# Patient Record
Sex: Female | Born: 1990 | Race: White | Hispanic: No | Marital: Single | Smoking: Never smoker
Health system: Southern US, Community
[De-identification: ages and names within clinical notes are randomized; demographics above are authoritative.]

## PROBLEM LIST (undated history)

## (undated) DIAGNOSIS — J45990 Exercise induced bronchospasm: Secondary | ICD-10-CM

## (undated) DIAGNOSIS — L309 Dermatitis, unspecified: Secondary | ICD-10-CM

## (undated) DIAGNOSIS — L0292 Furuncle, unspecified: Secondary | ICD-10-CM

## (undated) DIAGNOSIS — F909 Attention-deficit hyperactivity disorder, unspecified type: Secondary | ICD-10-CM

## (undated) HISTORY — DX: Exercise induced bronchospasm: J45.990

## (undated) HISTORY — DX: Furuncle, unspecified: L02.92

## (undated) HISTORY — DX: Attention-deficit hyperactivity disorder, unspecified type: F90.9

## (undated) HISTORY — DX: Dermatitis, unspecified: L30.9

---
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
## 2011-01-01 ENCOUNTER — Encounter: Payer: Self-pay | Admitting: Internal Medicine

## 2011-01-01 ENCOUNTER — Ambulatory Visit (INDEPENDENT_AMBULATORY_CARE_PROVIDER_SITE_OTHER): Payer: PRIVATE HEALTH INSURANCE | Admitting: Internal Medicine

## 2011-01-01 VITALS — BP 100/60 | HR 72 | Ht 63.0 in | Wt 154.0 lb

## 2011-01-01 DIAGNOSIS — F909 Attention-deficit hyperactivity disorder, unspecified type: Secondary | ICD-10-CM

## 2011-01-01 MED ORDER — LISDEXAMFETAMINE DIMESYLATE 30 MG PO CAPS: 30.0000 mg | ORAL_CAPSULE | ORAL | Status: DC

## 2011-01-01 NOTE — Progress Notes (Signed)
  Subjective:    Patient ID: Jessica Leach, female    DOB: 1991/03/17, 20 y.o.   MRN: [id number]  HPI Patient comes in today to discuss add issues. She just finished her freshman year at [employer]. Possible major in Psychology   15 hrs  each semester   Most of classes were larger classes and hard to pay  attention. Hard to focus for extended periods. Had done much better in a small classroom environment in high school. He got mostly B's but a DNA chemistry was 500 people in the classroom and that the statistical mouth. She did do okay this for some recession in [place].     Since her last visit here she has had no major changes in her health status except for episode of throat swelling given an EpiPen and an allergy evaluation. No recurrence unknown cause Review of Systems Negative for chest pain shortness of breath hearing vision issues GI/GU issues normal periods. Sleep is good adequate no snoring sleep apnea excessive alcohol use significant depression anxiety headaches.  Past history family history social history reviewed in the electronic medical record.      Objective:   Physical Exam Physical Exam: Vital signs reviewed [id number] is a well-developed well-nourished alert cooperative  white female who appears her stated age in no acute distress.  HEENT: normocephalic  traumatic , Eyes: PERRL EOM's full, conjunctiva clear, Nares: paten,t no deformity discharge or tenderness., Ears: no deformity EAC's clear TMs with normal landmarks. Mouth: clear OP, no lesions, edema.  Moist mucous membranes. Dentition in adequate repair. NECK: supple without masses, thyromegaly or bruits. CHEST/PULM:  Clear to auscultation and percussion breath sounds equal no wheeze , rales or rhonchi. No chest wall deformities or tenderness. CV: PMI is nondisplaced, S1 S2 no gallops, murmurs, rubs. Peripheral pulses are full without delay.No JVD .  ABDOMEN: Bowel sounds normal nontender  No guard or rebound, no  hepato splenomegal no CVA tenderness.   Extremtities:  No clubbing cyanosis or edema, no acute joint swelling or redness no focal atrophy NEURO:  Oriented x3, cranial nerves 3-12 appear to be intact, no obvious focal weakness,gait within normal limits no abnormal reflexes or asymmetrical SKIN: No acute rashes normal turgor, color, no bruising or petechiae. PSYCH: Oriented, , no obvious depression anxiety, cognition and judgment appear normal. Somewhat fidgety .      Assessment & Plan:  Add/adhd Previous evaluation in middle school 3 [employer] [facility]. Medication was an option at that time the decision was to not use that then. Jessica Leach is now feeling the difficulties with concentration and a larger more intense academic setting. She wishes to try medication in addition to other accommodations that she is tried.   At this time I do not believe need for further testing however if she needs more accommodations would have her be reevaluated.  Do not see underlying cause her habits aggravating the current problem.  Estimate length use of stimulant medications safety risk benefit. She did sign a controlled substances contract.  We'll begin with Vyvanse 30 mg one a day she will try to get a coupon on line. ROV in about a month before school starts. To adjust med dose as needed. She agrees to this plan.

## 2011-01-01 NOTE — Patient Instructions (Signed)
Can begin  vyvanse 30 mg  Each day  . Monitor concentration  And any side effects.  We may need to increase dose as needed. ROV  before school starts.   To decide on dosing and plan.

## 2011-01-28 ENCOUNTER — Telehealth: Payer: Self-pay | Admitting: *Deleted

## 2011-01-28 NOTE — Telephone Encounter (Signed)
[facility] Triage Call Report Triage Record Num: [id number] Operator: Annamaria Helling Patient Name: Jessica Leach Call Date & Time: 01/26/2011 12:37:37PM Patient Phone: [phone] PCP: Neta Mends. Panosh Patient Gender: Female PCP Fax : [phone] Patient DOB: September 07, 1990 Practice Name: [facility] Reason for Call: Tresa Endo, Mother, calling regarding Other. PCP is Panosh, Vickie Epley number is [phone]. Wt 145lb Grizel/patient calling recent rash, red, raised, itchy behind both knees and calf area. Onset 1week ago. Afebrile. History of Eczema couple of years ago. LMP 3weeks ago. Denies current pregnancy or breastfeeding. Current use of Hydrocortisone cream to area. All emergent symptoms per Rash or Redness-Localized and Cause unknown protocal r/o except "Severe localized itching and after 2 days of steroid cream and antihistamines" Instructed patient to call PCP office on 01/28/2011 AM to schedule appointment for MD to assess rash, verbalized understanding. Home care advice given. Verifed correct dose of Benadryl OTC per dosage chart. Protocol(s) Used: Itching Localized And Cause Unknown (Pediatric) Rash or Redness - Localized And Cause Unknown (Pediatric) Protocol(s) Used: Recommended Outcome per Protocol: See Provider within 72 Hours Reason for Outcome: Localized rash also present [1] Severe localized itching AND [2] after 2 days of steroid cream and antihistamines Care Advice: ~ LOCAL COLD: Apply ice or soak in cold water for 20 minutes every 3 or 4 hours to reduce itching or pain. AVOID SCRATCHING: Cut the fingernails short and discourage scratching to prevent a secondary infection from bacteria. ~ ANTIHISTAMINES: If itching persists, give Benadryl orally (OTC) every 6 hours as needed. (See Dosage table) Teen dose: 50 mg. ~ CALL BACK IF: - Rash becomes worse ~ ~ CARE ADVICE given per Rash or Redness - Localized and Cause Unknown (Pediatric)  guideline. HYDROCORTISONE CREAM: If the itch is more than mild, apply 1% hydrocortisone cream OTC ([place]: 0.5%) 3 times per day until it feels better. (Exception: suspected ringworm or impetigo.) ~ SEE PCP WITHIN 3 DAYS: Your child needs to be examined within 2 or 3 days. Call your child's doctor during regular office hours and make an appointment. (Note: if office will be open tomorrow, tell caller to call then, not in 3 days) ~ AVOID the CAUSE: - Try to find the cause. - Consider irritants like a plant (e.g., poison ivy or evergreens), chemicals (e.g., solvents or insecticides), Fiberglass, a new cosmetic, or new jewelry (called contact dermatitis). - A pet may be the intermediary (e.g., with poison ivy or poison oak) or the child may react directly to pet saliva. ~ 01/26/2011 12:54:46PM Page 1 of 1 [email or web address]

## 2011-01-29 ENCOUNTER — Encounter: Payer: Self-pay | Admitting: Internal Medicine

## 2011-01-29 ENCOUNTER — Ambulatory Visit (INDEPENDENT_AMBULATORY_CARE_PROVIDER_SITE_OTHER): Payer: PRIVATE HEALTH INSURANCE | Admitting: Internal Medicine

## 2011-01-29 VITALS — BP 100/70 | HR 60 | Wt 152.0 lb

## 2011-01-29 DIAGNOSIS — L739 Follicular disorder, unspecified: Secondary | ICD-10-CM

## 2011-01-29 DIAGNOSIS — L0292 Furuncle, unspecified: Secondary | ICD-10-CM

## 2011-01-29 DIAGNOSIS — F909 Attention-deficit hyperactivity disorder, unspecified type: Secondary | ICD-10-CM

## 2011-01-29 DIAGNOSIS — L738 Other specified follicular disorders: Secondary | ICD-10-CM

## 2011-01-29 HISTORY — DX: Furuncle, unspecified: L02.92

## 2011-01-29 MED ORDER — LISDEXAMFETAMINE DIMESYLATE 30 MG PO CAPS: 30.0000 mg | ORAL_CAPSULE | ORAL | Status: DC

## 2011-01-29 MED ORDER — DOXYCYCLINE HYCLATE 100 MG PO TABS: 100.0000 mg | ORAL_TABLET | Freq: Two times a day (BID) | ORAL | Status: AC

## 2011-01-29 NOTE — Progress Notes (Signed)
  Subjective:    Patient ID: Jessica Leach, female    DOB: [birth year], 20 y.o.   MRN: [id number]  HPI Patient comes in for followup of initiation of ADHD medication. She noted that the 30 mg of Vyvanse has been quite helpful in motivation focusing and finishing. Denies any significant side effects such as nausea vomiting serious mood issues. She did have a headache at the beginning and affected her sleep if she takes it late. But overall thinks it works pretty well. No chest pain shortness of breath.  New problem over the last weeks having bumps on her legs and some pustules last night that she squeezed to get pus out of this a large one on left thigh and a number of them behind her knees. She thinks it was eczema tends to scratch a lot. No other treatment.   Review of Systems Negative for vision change fever joint pain other rashes see above. Going back to school in less than 2 weeks.  Past history family history social history reviewed in the electronic medical record.     Objective:   Physical Exam Physical Exam: Vital signs reviewed [id number] is a well-developed well-nourished alert cooperative  white female who appears her stated age in no acute distress.  NECK: supple without masses, thyromegaly or bruits. CHEST/PULM:  Clear to auscultation and percussion breath sounds equal no wheeze , rales or rhonchi. No chest wall deformities or tenderness. CV: PMI is nondisplaced, S1 S2 no gallops, murmurs, rubs. Peripheral pulses are full without delay.No JVD .   Extremtities:  No clubbing cyanosis or edema, no acute joint swelling or redness no focal atrophy NEURO:  Oriented x3, cranial nerves 3-12 appear to be intact, no obvious focal weakness,gait within normal limits no abnormal reflexes or asymmetrical SKIN: Lower extremities left medial thigh about a 2 cm boil that is nonfluctuant but does have a center and surrounding erythema without streaking. There is another small boil on the right  calf and multiple papules folliculitis like him popliteal area as and ankles. There are no vesicles. No fluctuant PSYCH: Oriented, good eye contact, no obvious depression anxiety, cognition and judgment appear normal.      Assessment & Plan:  ADHD  Seemingly good response to medication. Given refill today have her sign a release so that her mom can help you out a prescription when she goes away to school. Med check in 3-4 months. Call if she feels elevated dosing might be more helpful. Caution with when she takes the medicine.  Skin infection boils  possible folliculitis from shaving and secondary infections. Nothing to culture today for concern about a staph infection no systemic symptoms. She does have a history of eczema.  Discussion local management and antibiotic compresses local care. Seek care with progression. No problem-specific assessment & plan notes found for this encounter.

## 2011-01-29 NOTE — Patient Instructions (Signed)
Take antibiotic for skin infection . Use warm compresses and antibiotic ointment also. Call if fever not getting better   acts like a  staph infection.  Call for refill of the vyvanse  . Sign release to let your mom pick up medication rx.  ROV in 3-4 months when on break    For med check.

## 2011-02-25 ENCOUNTER — Telehealth: Payer: Self-pay | Admitting: *Deleted

## 2011-02-25 NOTE — Telephone Encounter (Signed)
Mom called to ask if Vyvance could be making pt's hands sweat profusely???  Please call pt back if this is a normal reaction.

## 2011-02-25 NOTE — Telephone Encounter (Signed)
I think its possible but not typical  .   Perhaps it will get better with time

## 2011-02-26 NOTE — Telephone Encounter (Signed)
Left message with Dr. Rosezella [place] recommendations.

## 2011-03-05 ENCOUNTER — Telehealth: Payer: Self-pay | Admitting: Internal Medicine

## 2011-03-05 MED ORDER — LISDEXAMFETAMINE DIMESYLATE 40 MG PO CAPS: 40.0000 mg | ORAL_CAPSULE | ORAL | Status: DC

## 2011-03-05 NOTE — Telephone Encounter (Signed)
Ok  To increase to 40 mg vyvanse

## 2011-03-05 NOTE — Telephone Encounter (Signed)
Left message on machine that rx is ready to pick up. 

## 2011-03-05 NOTE — Telephone Encounter (Signed)
Pt is req to increase the Vyvanse to a 40mg  dose instead of 30mg ? Pls notify pts mom when script is ready for pick up. Pts moms name is, Hurshel Party.

## 2011-03-05 NOTE — Telephone Encounter (Signed)
Spoke to pt- she states that the 30mg . Pt takes it at 9am and makes her drowsy by 9:30am. She goes from being fine to being tired. Pt can't do homework at night because it wears off. She has to do her homework during the day.

## 2011-03-25 ENCOUNTER — Telehealth: Payer: Self-pay | Admitting: *Deleted

## 2011-03-25 NOTE — Telephone Encounter (Signed)
Per Dr. Fabian Sharp- does she have a fever, need more info on the pustule.  Left message on machine about this.

## 2011-03-25 NOTE — Telephone Encounter (Signed)
Pt has another pustule on her leg like the ones in July when she saw Dr,. Panosh, and wants to be treated without an office visit. Would like to speak to [facility].

## 2011-03-27 NOTE — Telephone Encounter (Signed)
Spoke to pt and she went to [facility] health at [facility]. They done a culture and gave her antibiotic. She hasn't gotten the results yet. She will let us know once she does.

## 2011-04-22 ENCOUNTER — Other Ambulatory Visit: Payer: Self-pay | Admitting: Internal Medicine

## 2011-04-22 MED ORDER — LISDEXAMFETAMINE DIMESYLATE 40 MG PO CAPS: 40.0000 mg | ORAL_CAPSULE | ORAL | Status: DC

## 2011-04-22 NOTE — Telephone Encounter (Signed)
Printed   Ask how this dosing is doing,

## 2011-04-22 NOTE — Telephone Encounter (Signed)
Pt need new rx requesting vyvanse 40mg 

## 2011-04-22 NOTE — Telephone Encounter (Signed)
Pls advise.  

## 2011-04-23 NOTE — Telephone Encounter (Signed)
Called pt to make aware that rx is ready for pick and to ask how dosage is working for pt.  Left a message for pt to return call.

## 2011-06-03 ENCOUNTER — Other Ambulatory Visit: Payer: Self-pay | Admitting: Internal Medicine

## 2011-06-03 MED ORDER — EPINEPHRINE 0.3 MG/0.3ML IJ DEVI: 0.3000 mg | Freq: Once | INTRAMUSCULAR | Status: AC

## 2011-06-03 MED ORDER — LISDEXAMFETAMINE DIMESYLATE 40 MG PO CAPS: 40.0000 mg | ORAL_CAPSULE | ORAL | Status: DC

## 2011-06-03 NOTE — Telephone Encounter (Signed)
Pt new rx vyvanse 40mg .

## 2011-07-08 ENCOUNTER — Other Ambulatory Visit: Payer: Self-pay | Admitting: Internal Medicine

## 2011-07-08 NOTE — Telephone Encounter (Signed)
Pt need new rx vyvanse 40 mg °

## 2011-07-09 MED ORDER — LISDEXAMFETAMINE DIMESYLATE 40 MG PO CAPS: 40.0000 mg | ORAL_CAPSULE | ORAL | Status: DC

## 2011-07-09 NOTE — Telephone Encounter (Signed)
Rx to be ready in the am. Left message on machine about needing a follow up on meds. She can schedule this when she comes back home for spring break.

## 2011-08-19 ENCOUNTER — Other Ambulatory Visit: Payer: Self-pay | Admitting: Internal Medicine

## 2011-08-19 NOTE — Telephone Encounter (Signed)
Pt need refill on vyanse 40 mg

## 2011-08-20 ENCOUNTER — Telehealth: Payer: Self-pay | Admitting: Internal Medicine

## 2011-08-20 MED ORDER — LISDEXAMFETAMINE DIMESYLATE 40 MG PO CAPS: 40.0000 mg | ORAL_CAPSULE | ORAL | Status: DC

## 2011-08-20 NOTE — Telephone Encounter (Signed)
Patient called stating she needs a refill on her vyvanse. Please assist and inform patient when available for pick up.

## 2011-08-20 NOTE — Telephone Encounter (Signed)
Pt is due for a follow up appt. Rx to be ready in the am. Pt states that she is not coming home but for a Friday, sat and it will be late on Friday. Then she plans on coming home in May. She will call back tomorrow to schedule a follow up in May.

## 2011-09-02 ENCOUNTER — Telehealth: Payer: Self-pay | Admitting: Internal Medicine

## 2011-09-02 NOTE — Telephone Encounter (Signed)
Pt should have gotten some on 2/19 #30. Left message for pt to call back. She should have enough until 3/19.

## 2011-09-02 NOTE — Telephone Encounter (Signed)
Pt called back and does have enough.

## 2011-09-02 NOTE — Telephone Encounter (Signed)
Pt called and said that she doesn't need script for Vyvanse. Pt has enough Vvanse to last until 09/19/11. Pt will call back when she needs another refill.

## 2011-09-19 ENCOUNTER — Other Ambulatory Visit: Payer: Self-pay | Admitting: Internal Medicine

## 2011-09-19 NOTE — Telephone Encounter (Signed)
Pt needs new rx vyvanse 40 mg °

## 2011-09-20 MED ORDER — LISDEXAMFETAMINE DIMESYLATE 40 MG PO CAPS: 40.0000 mg | ORAL_CAPSULE | ORAL | Status: DC

## 2011-09-20 NOTE — Telephone Encounter (Signed)
Pt last seen 01/29/11.  Rx last filled 08/20/11.  Pls advise.

## 2011-09-20 NOTE — Telephone Encounter (Signed)
She needs to get on appt schedule for MAY Then can rx for 30 of the vyvanse  To pick up.  Have her get on schedule  Before giving her the rx .

## 2011-09-20 NOTE — Telephone Encounter (Signed)
Left a message for pt to return call. Rx printed and ready for pick up.

## 2011-10-28 ENCOUNTER — Telehealth: Payer: Self-pay | Admitting: Internal Medicine

## 2011-10-28 NOTE — Telephone Encounter (Signed)
Pt last seen 01/29/11.  Rx last filled 09/20/11.  Pt has upcoming f/u 11/05/11.  Pls advise.

## 2011-10-28 NOTE — Telephone Encounter (Signed)
Ok to refill  But needs to keep her appt.

## 2011-10-28 NOTE — Telephone Encounter (Signed)
Pt called req refill of lisdexamfetamine (VYVANSE) 40 MG capsule. Pls call when ready for pick up.

## 2011-10-29 MED ORDER — LISDEXAMFETAMINE DIMESYLATE 40 MG PO CAPS: 40.0000 mg | ORAL_CAPSULE | ORAL | Status: DC

## 2011-10-29 NOTE — Telephone Encounter (Signed)
Pt aware rx ready for pick up.  Pt states she will be at appt on 11/05/11.

## 2011-11-05 ENCOUNTER — Ambulatory Visit (INDEPENDENT_AMBULATORY_CARE_PROVIDER_SITE_OTHER): Payer: PRIVATE HEALTH INSURANCE | Admitting: Internal Medicine

## 2011-11-05 ENCOUNTER — Encounter: Payer: Self-pay | Admitting: Internal Medicine

## 2011-11-05 VITALS — BP 110/70 | HR 69 | Temp 98.2°F | Wt 148.0 lb

## 2011-11-05 DIAGNOSIS — F909 Attention-deficit hyperactivity disorder, unspecified type: Secondary | ICD-10-CM

## 2011-11-05 DIAGNOSIS — J309 Allergic rhinitis, unspecified: Secondary | ICD-10-CM

## 2011-11-05 DIAGNOSIS — Z79899 Other long term (current) drug therapy: Secondary | ICD-10-CM

## 2011-11-05 DIAGNOSIS — H5789 Other specified disorders of eye and adnexa: Secondary | ICD-10-CM

## 2011-11-05 DIAGNOSIS — H579 Unspecified disorder of eye and adnexa: Secondary | ICD-10-CM

## 2011-11-05 DIAGNOSIS — Z5189 Encounter for other specified aftercare: Secondary | ICD-10-CM

## 2011-11-05 MED ORDER — LISDEXAMFETAMINE DIMESYLATE 40 MG PO CAPS: 40.0000 mg | ORAL_CAPSULE | ORAL | Status: DC

## 2011-11-05 NOTE — Progress Notes (Signed)
  Subjective:    Patient ID: Jessica Leach, female    DOB: Jan 09, 1991, 21 y.o.   MRN: [id number]  HPI Comes in  For med evaluation  .  Just finished exams  At [facility] .  Med seems to do better and lasts longer on the 40 mg of vyvanse  Takes at about   7-8 in am   Done at  5 . Sleep. Ok  NO cp sob aggravated mood issues  Weekend ok when study.   Course load.  On campus  to be in [employer] .  No injuries or other major change in health Neg td or sig etoh.  Asthma  Allergy stable at this time   Right eye irriated at times  Has some focal redness no vision changes  At this time . Not that itchy.  Review of Systems ROS:  GEN/ HEENT: No fever, significant weight changes sweats headaches vision problems hearing changes, CV/ PULM; No chest pain shortness of breath cough, syncope,edema  change in exercise tolerance. GI /GU: No adominal pain, vomiting, change in bowel habits. No blood in the stool. No significant GU symptoms. SKIN/HEME: ,no acute skin rashes suspicious lesions or bleeding. No lymphadenopathy, nodules, masses.  NEURO/ PSYCH:  No neurologic signs such as weakness numbness. No depression anxiety. IMM/ Allergy: No unusual infections.  Allergy .   REST of 12 system review negative except as per HPI  Past history family history social history reviewed in the electronic medical record.     Objective:   Physical Exam BP 110/70  Pulse 69  Temp(Src) 98.2 F (36.8 C) (Oral)  Wt 148 lb (67.132 kg)  SpO2 98%  LMP 10/22/2011 Physical Exam: Vital signs reviewed WUJ:WJXB is a well-developed well-nourished alert cooperative  white female who appears her stated age in no acute distress.  HEENT: normocephalic atraumatic , Eyes: PERRL EOM's full, conjunctiva promonent bv inferior no dc  On right  Nares: paten,t no deformity discharge or tenderness., NECK: supple without masses, thyromegaly or bruits. CHEST/PULM:  Clear to auscultation and percussion breath sounds equal no wheeze ,  rales or rhonchi. No chest wall deformities or tenderness. CV: PMI is nondisplaced, S1 S2 no gallops, murmurs, rubs. Peripheral pulses are full without delay.No JVD .  ABDOMEN: Bowel sounds normal nontender  No guard or rebound, no hepato splenomegal no CVA tenderness.   Extremtities:  No clubbing cyanosis or edema, no acute joint swelling or redness no focal atrophy NEURO:  Oriented x3, cranial nerves 3-12 appear to be intact, no obvious focal weakness,gait within normal limits no abnormal reflexes or asymmetrical no tremor  Some increase fidgeting at times  SKIN: No acute rashes normal turgor, color, no bruising or petechiae. PSYCH: Oriented, good eye contact, no obvious depression anxiety, cognition and judgment appear normal. LN: no cervical  adenopathy      Assessment & Plan:  ADHD  Medication management   Seems to have better response ofn 40 mg would not increase at this time. Refill med 90 days  Call for refill  Wellness visit in 6 months can work in around her school schedule or  If needed for adjustment of meds .  All asthm stable  Focal eye redness or irritation   No ob uveitis conj or scleritis  However if continue sx see eye doc.

## 2011-11-05 NOTE — Patient Instructions (Signed)
Continue same dosing for now.  Get eye check if continues to bother your but looks ok today.  ROV 6 months with check up or thereabout.

## 2011-11-09 ENCOUNTER — Encounter: Payer: Self-pay | Admitting: Internal Medicine

## 2011-11-09 DIAGNOSIS — L309 Dermatitis, unspecified: Secondary | ICD-10-CM | POA: Insufficient documentation

## 2011-11-09 DIAGNOSIS — J309 Allergic rhinitis, unspecified: Secondary | ICD-10-CM | POA: Insufficient documentation

## 2011-11-09 DIAGNOSIS — Z79899 Other long term (current) drug therapy: Secondary | ICD-10-CM | POA: Insufficient documentation

## 2011-12-23 ENCOUNTER — Encounter: Payer: Self-pay | Admitting: Internal Medicine

## 2011-12-23 ENCOUNTER — Ambulatory Visit (INDEPENDENT_AMBULATORY_CARE_PROVIDER_SITE_OTHER): Payer: PRIVATE HEALTH INSURANCE | Admitting: Internal Medicine

## 2011-12-23 VITALS — BP 120/82 | HR 110 | Temp 98.5°F | Wt 141.0 lb

## 2011-12-23 DIAGNOSIS — J069 Acute upper respiratory infection, unspecified: Secondary | ICD-10-CM | POA: Insufficient documentation

## 2011-12-23 MED ORDER — ALBUTEROL SULFATE HFA 108 (90 BASE) MCG/ACT IN AERS: 2.0000 | INHALATION_SPRAY | Freq: Four times a day (QID) | RESPIRATORY_TRACT | Status: DC | PRN

## 2011-12-23 NOTE — Patient Instructions (Signed)
This appears to be a viral respiratory infection. Your lung exam is normal at this time. Most likely will resolve in another week. Can use inhaler if wheeze occurs . Seek care if fever shortness of breath signs .  Antibiotics wont help a viral infection.

## 2011-12-23 NOTE — Progress Notes (Signed)
Subjective:    Patient ID: Jessica Leach, female    DOB: 10-26-1990, 21 y.o.   MRN: [id number]  HPI Patient comes in today for SDA for  new problem evaluation. Onset less than a week ago with coughing chest cold symptoms upper respiratory congestion. See above.Was at the beach and coughing bad   Had sinus headache.   Concern from father because she is going out of country in 2 days to [place] for over a month. No fever.   Took decongestant  allertec d and nyquil .  No ets. No sob and no wheezing  Last decongestant  This am.   Review of Systems Negative for fever chills wheezing chest pain shortness of breath history of inhaler use pre-exercise in the past with sports. No unusual bleeding vision changes. Has had a problem with her hip when she does deep squats with lateral pain she may have strained something gait is normal.  Past history family history social history reviewed in the electronic medical record. Outpatient Encounter Prescriptions as of 12/23/2011  Medication Sig Dispense Refill  . albuterol (VENTOLIN HFA) 108 (90 BASE) MCG/ACT inhaler Inhale 2 puffs into the lungs every 6 (six) hours as needed for wheezing (or pre exercise).  1 Inhaler  2  . cetirizine (ZYRTEC) 10 MG tablet Take 10 mg by mouth daily.      Marland Kitchen EPINEPHrine (EPI-PEN) 0.3 mg/0.3 mL DEVI Inject 0.3 mLs (0.3 mg total) into the muscle once.  1 Device  0  . lisdexamfetamine (VYVANSE) 40 MG capsule Take 1 capsule (40 mg total) by mouth every Leach.  90 capsule  0  . DISCONTD: albuterol (VENTOLIN HFA) 108 (90 BASE) MCG/ACT inhaler Inhale 2 puffs into the lungs every 6 (six) hours as needed.        Marland Kitchen DISCONTD: lisdexamfetamine (VYVANSE) 30 MG capsule Take 30 mg by mouth every Leach.      Marland Kitchen DISCONTD: lisdexamfetamine (VYVANSE) 40 MG capsule Take 1 capsule (40 mg total) by mouth every Leach.  30 capsule  0  . DISCONTD: pseudoephedrine (SUDAFED) 30 MG tablet Take 30 mg by mouth every 4 (four) hours as needed.             Objective:   Physical Exam BP 120/82  Pulse 110  Temp 98.5 F (36.9 C) (Oral)  Wt 141 lb (63.957 kg)  SpO2 99%  LMP 12/16/2011 WDWN in NAD  quiet respirations; mildly congested  somewhat hoarse. Non toxic . HEENT: Normocephalic ;atraumatic , Eyes;  PERRL, EOMs  Full, lids and conjunctiva clear,,Ears: no deformities, canals nl, TM landmarks normal, Nose: no deformity or discharge but congested;face minimally tender Mouth : OP clear without lesion or edema . Neck: Supple without adenopathy or masses or bruits Chest:  Clear to A&P without wheezes rales or rhonchi CV:  S1-S2 no gallops or murmurs peripheral perfusion is normal Skin :nl perfusion and no acute rashes  Musculoskeletal gait is within normal limits points to the area the right lateral hip and anterior iliac crest as area of discomfort when she does certain movements. Good range of motion of hip      Assessment & Plan:  Libby Maw uri  prob viral  leaving for [place] in 2 day . No indication at this time for antibiotic use discussed alarm features for which she should seek care.  Remote hx of eia; no wheezing now but travel with inhaler and sx rx for now; sudafed pre flight to help with  Musculoskeletal strain of hip  girdle avoid deep squats assistance training with bands might be helpful instead. Ice and Advil okay.  Requests copy of her immunization record printed for her from the registry today.

## 2012-03-03 ENCOUNTER — Other Ambulatory Visit: Payer: Self-pay | Admitting: Internal Medicine

## 2012-03-03 MED ORDER — LISDEXAMFETAMINE DIMESYLATE 40 MG PO CAPS: 40.0000 mg | ORAL_CAPSULE | ORAL | Status: DC

## 2012-03-03 NOTE — Telephone Encounter (Signed)
Left message on personal cell for the pt to pick up the rx at the front desk.

## 2012-03-03 NOTE — Telephone Encounter (Signed)
Pt needs new rx vyvanse 40 mg °

## 2012-03-03 NOTE — Telephone Encounter (Signed)
Printed and waiting on [facility] signature.

## 2012-05-08 ENCOUNTER — Encounter: Payer: Self-pay | Admitting: Internal Medicine

## 2012-05-08 ENCOUNTER — Encounter: Payer: PRIVATE HEALTH INSURANCE | Admitting: Internal Medicine

## 2012-05-08 DIAGNOSIS — Z0289 Encounter for other administrative examinations: Secondary | ICD-10-CM

## 2012-05-08 NOTE — Patient Instructions (Signed)
Preventive Care for Adults, Female A healthy lifestyle and preventive care can promote health and wellness. Preventive health guidelines for women include the following key practices.  A routine yearly physical is a good way to check with your caregiver about your health and preventive screening. It is a chance to share any concerns and updates on your health, and to receive a thorough exam.  Visit your dentist for a routine exam and preventive care every 6 months. Brush your teeth twice a day and floss once a day. Good oral hygiene prevents tooth decay and gum disease.  The frequency of eye exams is based on your age, health, family medical history, use of contact lenses, and other factors. Follow your caregiver's recommendations for frequency of eye exams.  Eat a healthy diet. Foods like vegetables, fruits, whole grains, low-fat dairy products, and lean protein foods contain the nutrients you need without too many calories. Decrease your intake of foods high in solid fats, added sugars, and salt. Eat the right amount of calories for you.Get information about a proper diet from your caregiver, if necessary.  Regular physical exercise is one of the most important things you can do for your health. Most adults should get at least 150 minutes of moderate-intensity exercise (any activity that increases your heart rate and causes you to sweat) each week. In addition, most adults need muscle-strengthening exercises on 2 or more days a week.  Maintain a healthy weight. The body mass index (BMI) is a screening tool to identify possible weight problems. It provides an estimate of body fat based on height and weight. Your caregiver can help determine your BMI, and can help you achieve or maintain a healthy weight.For adults 20 years and older:  A BMI below 18.5 is considered underweight.  A BMI of 18.5 to 24.9 is normal.  A BMI of 25 to 29.9 is considered overweight.  A BMI of 30 and above is  considered obese.  Maintain normal blood lipids and cholesterol levels by exercising and minimizing your intake of saturated fat. Eat a balanced diet with plenty of fruit and vegetables. Blood tests for lipids and cholesterol should begin at age 33 and be repeated every 5 years. If your lipid or cholesterol levels are high, you are over 50, or you are at high risk for heart disease, you may need your cholesterol levels checked more frequently.Ongoing high lipid and cholesterol levels should be treated with medicines if diet and exercise are not effective.  If you smoke, find out from your caregiver how to quit. If you do not use tobacco, do not start.  If you are pregnant, do not drink alcohol. If you are breastfeeding, be very cautious about drinking alcohol. If you are not pregnant and choose to drink alcohol, do not exceed 1 drink per day. One drink is considered to be 12 ounces (355 mL) of beer, 5 ounces (148 mL) of wine, or 1.5 ounces (44 mL) of liquor.  Avoid use of street drugs. Do not share needles with anyone. Ask for help if you need support or instructions about stopping the use of drugs.  High blood pressure causes heart disease and increases the risk of stroke. Your blood pressure should be checked at least every 1 to 2 years. Ongoing high blood pressure should be treated with medicines if weight loss and exercise are not effective.  If you are 60 to 20 years old, ask your caregiver if you should take aspirin to prevent strokes.  screening involves taking a blood sample to check your fasting blood sugar level. This should be done once every 3 years, after age 45, if you are within normal weight and without risk factors for diabetes. Testing should be considered at a younger age or be carried out more frequently if you are overweight and have at least 1 risk factor for diabetes.  Breast cancer screening is essential preventive care for women. You should practice "breast  self-awareness." This means understanding the normal appearance and feel of your breasts and may include breast self-examination. Any changes detected, no matter how small, should be reported to a caregiver. Women in their 20s and 30s should have a clinical breast exam (CBE) by a caregiver as part of a regular health exam every 1 to 3 years. After age 40, women should have a CBE every year. Starting at age 40, women should consider having a mammography (breast X-ray test) every year. Women who have a family history of breast cancer should talk to their caregiver about genetic screening. Women at a high risk of breast cancer should talk to their caregivers about having magnetic resonance imaging (MRI) and a mammography every year.  The Pap test is a screening test for cervical cancer. A Pap test can show cell changes on the cervix that might become cervical cancer if left untreated. A Pap test is a procedure in which cells are obtained and examined from the lower end of the uterus (cervix).  Women should have a Pap test starting at age 21.  Between ages 21 and 29, Pap tests should be repeated every 2 years.  Beginning at age 30, you should have a Pap test every 3 years as long as the past 3 Pap tests have been normal.  Some women have medical problems that increase the chance of getting cervical cancer. Talk to your caregiver about these problems. It is especially important to talk to your caregiver if a new problem develops soon after your last Pap test. In these cases, your caregiver may recommend more frequent screening and Pap tests.  The above recommendations are the same for women who have or have not gotten the vaccine for human papillomavirus (HPV).  If you had a hysterectomy for a problem that was not cancer or a condition that could lead to cancer, then you no longer need Pap tests. Even if you no longer need a Pap test, a regular exam is a good idea to make sure no other problems are  starting.  If you are between ages 65 and 70, and you have had normal Pap tests going back 10 years, you no longer need Pap tests. Even if you no longer need a Pap test, a regular exam is a good idea to make sure no other problems are starting.  If you have had past treatment for cervical cancer or a condition that could lead to cancer, you need Pap tests and screening for cancer for at least 20 years after your treatment.  If Pap tests have been discontinued, risk factors (such as a new sexual partner) need to be reassessed to determine if screening should be resumed.  The HPV test is an additional test that may be used for cervical cancer screening. The HPV test looks for the virus that can cause the cell changes on the cervix. The cells collected during the Pap test can be tested for HPV. The HPV test could be used to screen women aged 30 years and older, and should   should be used in women of any age who have unclear Pap test results. After the age of 31, women should have HPV testing at the same frequency as a Pap test.  Colorectal cancer can be detected and often prevented. Most routine colorectal cancer screening begins at the age of 10 and continues through age 92. However, your caregiver may recommend screening at an earlier age if you have risk factors for colon cancer. On a yearly basis, your caregiver may provide home test kits to check for hidden blood in the stool. Use of a small camera at the end of a tube, to directly examine the colon (sigmoidoscopy or colonoscopy), can detect the earliest forms of colorectal cancer. Talk to your caregiver about this at age 22, when routine screening begins. Direct examination of the colon should be repeated every 5 to 10 years through age 23, unless early forms of pre-cancerous polyps or small growths are found.  Hepatitis C blood testing is recommended for all people born from [birth year] through 1964 and any individual with known risks for hepatitis C.  Practice  safe sex. Use condoms and avoid high-risk sexual practices to reduce the spread of sexually transmitted infections (STIs). STIs include gonorrhea, chlamydia, syphilis, trichomonas, herpes, HPV, and human immunodeficiency virus (HIV). Herpes, HIV, and HPV are viral illnesses that have no cure. They can result in disability, cancer, and death. Sexually active women aged 74 and younger should be checked for chlamydia. Older women with new or multiple partners should also be tested for chlamydia. Testing for other STIs is recommended if you are sexually active and at increased risk.  Osteoporosis is a disease in which the bones lose minerals and strength with aging. This can result in serious bone fractures. The risk of osteoporosis can be identified using a bone density scan. Women ages 2 and over and women at risk for fractures or osteoporosis should discuss screening with their caregivers. Ask your caregiver whether you should take a calcium supplement or vitamin D to reduce the rate of osteoporosis.  Menopause can be associated with physical symptoms and risks. Hormone replacement therapy is available to decrease symptoms and risks. You should talk to your caregiver about whether hormone replacement therapy is right for you.  Use sunscreen with sun protection factor (SPF) of 30 or more. Apply sunscreen liberally and repeatedly throughout the day. You should seek shade when your shadow is shorter than you. Protect yourself by wearing long sleeves, pants, a wide-brimmed hat, and sunglasses year round, whenever you are outdoors.  Once a month, do a whole body skin exam, using a mirror to look at the skin on your back. Notify your caregiver of new moles, moles that have irregular borders, moles that are larger than a pencil eraser, or moles that have changed in shape or color.  Stay current with required immunizations.  Influenza. You need a dose every fall (or winter). The composition of the flu vaccine  changes each year, so being vaccinated once is not enough.  Pneumococcal polysaccharide. You need 1 to 2 doses if you smoke cigarettes or if you have certain chronic medical conditions. You need 1 dose at age 92 (or older) if you have never been vaccinated.  Tetanus, diphtheria, pertussis (Tdap, Td). Get 1 dose of Tdap vaccine if you are younger than age 25, are over 83 and have contact with an infant, are a Research scientist (physical sciences), are pregnant, or simply want to be protected from whooping cough. After that, you need a  Td booster dose every 10 years. Consult your caregiver if you have not had at least 3 tetanus and diphtheria-containing shots sometime in your life or have a deep or dirty wound.  HPV. You need this vaccine if you are a woman age 24 or younger. The vaccine is given in 3 doses over 6 months.  Measles, mumps, rubella (MMR). You need at least 1 dose of MMR if you were born in 1957 or later. You may also need a second dose.  Meningococcal. If you are age 46 to 36 and a first-year college student living in a residence hall, or have one of several medical conditions, you need to get vaccinated against meningococcal disease. You may also need additional booster doses.  Zoster (shingles). If you are age 66 or older, you should get this vaccine.  Varicella (chickenpox). If you have never had chickenpox or you were vaccinated but received only 1 dose, talk to your caregiver to find out if you need this vaccine.  Hepatitis A. You need this vaccine if you have a specific risk factor for hepatitis A virus infection or you simply wish to be protected from this disease. The vaccine is usually given as 2 doses, 6 to 18 months apart.  Hepatitis B. You need this vaccine if you have a specific risk factor for hepatitis B virus infection or you simply wish to be protected from this disease. The vaccine is given in 3 doses, usually over 6 months. Preventive Services / Frequency Ages 68 to 55  Blood  pressure check.** / Every 1 to 2 years.  Lipid and cholesterol check.** / Every 5 years beginning at age 48.  Clinical breast exam.** / Every 3 years for women in their 66s and 30s.  Pap test.** / Every 2 years from ages 77 through 49. Every 3 years starting at age 80 through age 56 or 9 with a history of 3 consecutive normal Pap tests.  HPV screening.** / Every 3 years from ages 88 through ages 31 to 72 with a history of 3 consecutive normal Pap tests.  Hepatitis C blood test.** / For any individual with known risks for hepatitis C.  Skin self-exam. / Monthly.  Influenza immunization.** / Every year.  Pneumococcal polysaccharide immunization.** / 1 to 2 doses if you smoke cigarettes or if you have certain chronic medical conditions.  Tetanus, diphtheria, pertussis (Tdap, Td) immunization. / A one-time dose of Tdap vaccine. After that, you need a Td booster dose every 10 years.  HPV immunization. / 3 doses over 6 months, if you are 30 and younger.  Measles, mumps, rubella (MMR) immunization. / You need at least 1 dose of MMR if you were born in 1957 or later. You may also need a second dose.  Meningococcal immunization. / 1 dose if you are age 24 to 98 and a first-year college student living in a residence hall, or have one of several medical conditions, you need to get vaccinated against meningococcal disease. You may also need additional booster doses.  Varicella immunization.** / Consult your caregiver.  Hepatitis A immunization.** / Consult your caregiver. 2 doses, 6 to 18 months apart.  Hepatitis B immunization.** / Consult your caregiver. 3 doses usually over 6 months. . Document Released: 08/13/2001 Document Revised: 09/09/2011 Document Reviewed: 11/12/2010 [facility] Patient Information 2013 [place], [place].

## 2012-05-08 NOTE — Progress Notes (Signed)
Document opened for review  pt ns and rescheduled

## 2012-05-12 ENCOUNTER — Telehealth: Payer: Self-pay | Admitting: Family Medicine

## 2012-05-12 NOTE — Telephone Encounter (Signed)
done

## 2012-05-12 NOTE — Telephone Encounter (Signed)
Please send the pt a letter.  She missed her CPX appt on 05/08/12 with WP.

## 2012-05-12 NOTE — Telephone Encounter (Signed)
Message copied by Nils Flack on Tue May 12, 2012 12:07 PM ------      Message from: [facility] [place] K      Created: Fri May 08, 2012  5:46 PM      Regarding: no show?        Please contact patient  about her missed appt.      She is a Consulting civil engineer .       Thanks w p

## 2012-05-26 ENCOUNTER — Encounter: Payer: Self-pay | Admitting: Internal Medicine

## 2012-05-26 ENCOUNTER — Ambulatory Visit (INDEPENDENT_AMBULATORY_CARE_PROVIDER_SITE_OTHER): Payer: BC Managed Care – PPO | Admitting: Internal Medicine

## 2012-05-26 ENCOUNTER — Ambulatory Visit: Payer: PRIVATE HEALTH INSURANCE | Admitting: Internal Medicine

## 2012-05-26 VITALS — BP 110/70 | HR 95 | Temp 98.2°F | Wt 139.0 lb

## 2012-05-26 DIAGNOSIS — J45990 Exercise induced bronchospasm: Secondary | ICD-10-CM | POA: Insufficient documentation

## 2012-05-26 DIAGNOSIS — F909 Attention-deficit hyperactivity disorder, unspecified type: Secondary | ICD-10-CM

## 2012-05-26 DIAGNOSIS — Z79899 Other long term (current) drug therapy: Secondary | ICD-10-CM

## 2012-05-26 MED ORDER — LISDEXAMFETAMINE DIMESYLATE 20 MG PO CAPS: ORAL_CAPSULE | ORAL | Status: DC

## 2012-05-26 MED ORDER — LISDEXAMFETAMINE DIMESYLATE 40 MG PO CAPS: 40.0000 mg | ORAL_CAPSULE | ORAL | Status: DC

## 2012-05-26 NOTE — Progress Notes (Signed)
Chief Complaint  Patient presents with  . Follow-up    Meds    HPI: Prt comes  in for fu of adhd medication and management. She is now on vyvanse40 mg a day and states it works fairly well. However around 2 or 3 in the afternoon she feels it wear off and is difficult sometimes to keep up the pace of her necessary tasks.  Up at 7 am and last till about 2 pm and using caffeine also with this.  And adds soda  Tired by 5 pm.  Most  Days. Takes it  14.5 hours credit hours this semester Asks about adding on short acting medication on the days that her medicine wears off and she needs to be attentive. Sleep :   Busy med doesn't  Interfere.  Mood : Denies significant depression anxiety just a lot of stress with getting everything done periods once a month . 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  ROS: See pertinent positives and negatives per HPI. Asthma stable exercising without difficulty  Get elevated pulse rate   But not with running and no palpitaions or cv pulm sx. 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  Past Medical History  Diagnosis Date  . Allergic rhinitis   . Eczema   . ADHD (attention deficit hyperactivity disorder)     evaluation at [facility]  . Exercise-induced asthma     inhaler pre exercise  . Boils 01/29/2011    No family history on file.  History   Social History  . Marital Status: Single    Spouse Name: N/A    Number of Children: N/A  . Years of Education: N/A   Social History Main Topics  . Smoking status: Never Smoker   . Smokeless tobacco: None  . Alcohol Use: None  . Drug Use: None  . Sexually Active: None   Other Topics Concern  . None   Social History Narrative   HH of 3No etsjunitor   [employer] business school Lives .soroity house [employer]egu[employer] in summerParents Onalee Hua and [employer] tennis    Outpatient Encounter Prescriptions as of 05/26/2012  Medication Sig  Dispense Refill  . albuterol (VENTOLIN HFA) 108 (90 BASE) MCG/ACT inhaler Inhale 2 puffs into the lungs every 6 (six) hours as needed for wheezing (or pre exercise).  1 Inhaler  2  . EPINEPHrine (EPI-PEN) 0.3 mg/0.3 mL DEVI Inject 0.3 mLs (0.3 mg total) into the muscle once.  1 Device  0  . lisdexamfetamine (VYVANSE) 40 MG capsule Take 1 capsule (40 mg total) by mouth every morning. Can add on 20 mg as directed  90 capsule  0  . [DISCONTINUED] lisdexamfetamine (VYVANSE) 40 MG capsule Take 1 capsule (40 mg total) by mouth every morning.  90 capsule  0  . cetirizine (ZYRTEC) 10 MG tablet Take 10 mg by mouth daily.      Marland Kitchen lisdexamfetamine (VYVANSE) 20 MG capsule Can take after 40 mg dose as directed once a day  30 capsule  0    EXAM:  BP 110/70  Pulse 95  Temp 98.2 F (36.8 C) (Oral)  Wt 139 lb (63.05 kg)  SpO2 99%  LMP 05/13/2012  There is no height on file to calculate BMI. Wt Readings from Last 3 Encounters:  05/26/12 139 lb (63.05 kg)  12/23/11 141 lb (63.957 kg) (70.66%*)  11/05/11 148 lb (67.132 kg) (78.69%*)   *  Growth percentiles are based on CDC 2-20 Years data.    GENERAL: vitals reviewed and listed above, alert, oriented, appears well hydrated and in no acute distress  HEENT: atraumatic, conjunctiva  clear, no obvious abnormalities on inspection of external nose and ears OP : no lesion edema or exudate   NECK: no obvious masses on inspection palpation   LUNGS: clear to auscultation bilaterally, no wheezes, rales or rhonchi, good air movement  CV: HRRR, no clubbing cyanosis or  peripheral edema nl cap refill   pulse is 72 on repeat laying  MS: moves all extremities without noticeable focal  abnormality Neurologic nonfocal no tremor PSYCH:  cooperative, no obvious depression or anxiety  some decrease in eye contact appropriate speech.  ASSESSMENT AND PLAN:  Discussed the following assessment and plan:  1. ADHD (attention deficit hyperactivity disorder)    Specimen  about medication and afternoon wall is a circadian issue without a DD advised against trying to mix and match different medic  2. Medication management   3. Exercise-induced asthma    Stable at this time   we will continue on the 40 mg in the morning and she can add 10-20 mg 2-3 hours later to see if she gets the desired effect of lasting longer without too much medication. These would be on intensely academic days weekend she could stay on the 40 mg. Caution about side effects have her come back in 3-4 months only 30 pills of the 20 mg were given today we'll see how it works.  -Patient advised to return or notify health care team  immediately if symptoms worsen or persist or new concerns arise.  Patient Instructions  Continue vyvanse 40 and can add on 10 - 20 mg on days as directed not to late to avoid sleep interference  Avoid excess caffeine  rov in 3-4  Months about how this is working .   Neta Mends. Kippy Gohman M.D.

## 2012-05-26 NOTE — Patient Instructions (Addendum)
Continue vyvanse 40 and can add on 10 - 20 mg on days as directed not to late to avoid sleep interference  Avoid excess caffeine  rov in 3-4  Months about how this is working .

## 2012-07-13 ENCOUNTER — Telehealth: Payer: Self-pay | Admitting: Internal Medicine

## 2012-07-13 NOTE — Telephone Encounter (Signed)
Pt needs new rx vyvanse 20 mg #90.

## 2012-07-14 ENCOUNTER — Other Ambulatory Visit: Payer: Self-pay | Admitting: Family Medicine

## 2012-07-14 MED ORDER — LISDEXAMFETAMINE DIMESYLATE 20 MG PO CAPS: ORAL_CAPSULE | ORAL | Status: DC

## 2012-07-14 NOTE — Telephone Encounter (Signed)
Left message on home phone for her to pick up rx at the front desk.

## 2012-10-08 ENCOUNTER — Telehealth: Payer: Self-pay | Admitting: Internal Medicine

## 2012-10-08 NOTE — Telephone Encounter (Signed)
Patient called stating that she need a refill of her vyvanse 40 mg 1poqd and vyvanse 20 mg 1poqd prn. Please assist.

## 2012-10-12 ENCOUNTER — Other Ambulatory Visit: Payer: Self-pay | Admitting: Family Medicine

## 2012-10-12 MED ORDER — LISDEXAMFETAMINE DIMESYLATE 20 MG PO CAPS: ORAL_CAPSULE | ORAL | Status: DC

## 2012-10-12 MED ORDER — LISDEXAMFETAMINE DIMESYLATE 40 MG PO CAPS: 40.0000 mg | ORAL_CAPSULE | ORAL | Status: DC

## 2012-10-12 NOTE — Telephone Encounter (Signed)
Left message on personally identified home number informing the pt to pick up at the front desk.

## 2013-02-10 ENCOUNTER — Telehealth: Payer: Self-pay | Admitting: Internal Medicine

## 2013-02-10 NOTE — Telephone Encounter (Signed)
Pt needs 2 new rxs vyvanse 20 mg and vyvanse 40 mg.

## 2013-02-12 NOTE — Telephone Encounter (Signed)
Patient must make appt and be seen in the office for refills.  Please make appt with the pt.

## 2013-02-12 NOTE — Telephone Encounter (Signed)
Unable to leave message mailbox full.

## 2013-02-16 NOTE — Telephone Encounter (Signed)
Pt mother is aware must make appt

## 2013-04-02 ENCOUNTER — Encounter: Payer: Self-pay | Admitting: Internal Medicine

## 2013-04-13 ENCOUNTER — Telehealth: Payer: Self-pay | Admitting: Internal Medicine

## 2013-04-13 NOTE — Telephone Encounter (Signed)
Last seen 05/26/2012. Please advise.  Thanks!

## 2013-04-13 NOTE — Telephone Encounter (Signed)
Ok to give enough until the nov 26 appt.   My notes advised to recheck with this dosing   And not to use the 20 mg  Add on every day . Can give 1 a day of each till next appt  .  Marland Kitchen

## 2013-04-13 NOTE — Telephone Encounter (Signed)
Pt called and scheduled a med check for 05/26/13. She is requesting enough of her medications to last her until then. She will not have a break from school until that date. lisdexamfetamine (VYVANSE) 20 MG capsule and lisdexamfetamine (VYVANSE) 40 MG capsule. Please assist.

## 2013-04-15 ENCOUNTER — Encounter: Payer: Self-pay | Admitting: Internal Medicine

## 2013-04-15 MED ORDER — LISDEXAMFETAMINE DIMESYLATE 20 MG PO CAPS: ORAL_CAPSULE | ORAL | Status: DC

## 2013-04-15 MED ORDER — LISDEXAMFETAMINE DIMESYLATE 40 MG PO CAPS: 40.0000 mg | ORAL_CAPSULE | ORAL | Status: DC

## 2013-04-15 NOTE — Telephone Encounter (Signed)
Please print.  Do not feel comfortable filling this.

## 2013-04-15 NOTE — Telephone Encounter (Signed)
30 of each printed at this time.

## 2013-04-16 NOTE — Telephone Encounter (Signed)
Rx printed and sent up front for pick up.  Called and left a message that rx are ready.

## 2013-05-12 ENCOUNTER — Other Ambulatory Visit (HOSPITAL_COMMUNITY): Payer: Self-pay | Admitting: Orthodontics and Dentofacial Orthopedics

## 2013-05-12 DIAGNOSIS — M2669 Other specified disorders of temporomandibular joint: Secondary | ICD-10-CM

## 2013-05-21 ENCOUNTER — Ambulatory Visit (HOSPITAL_COMMUNITY): Payer: BC Managed Care – PPO

## 2013-05-25 ENCOUNTER — Encounter: Payer: Self-pay | Admitting: Internal Medicine

## 2013-05-26 ENCOUNTER — Encounter: Payer: Self-pay | Admitting: Internal Medicine

## 2013-05-26 ENCOUNTER — Ambulatory Visit (INDEPENDENT_AMBULATORY_CARE_PROVIDER_SITE_OTHER): Payer: BC Managed Care – PPO | Admitting: Internal Medicine

## 2013-05-26 VITALS — BP 94/50 | HR 84 | Temp 98.1°F | Wt 148.0 lb

## 2013-05-26 DIAGNOSIS — Z79899 Other long term (current) drug therapy: Secondary | ICD-10-CM

## 2013-05-26 DIAGNOSIS — K219 Gastro-esophageal reflux disease without esophagitis: Secondary | ICD-10-CM | POA: Insufficient documentation

## 2013-05-26 DIAGNOSIS — F909 Attention-deficit hyperactivity disorder, unspecified type: Secondary | ICD-10-CM

## 2013-05-26 MED ORDER — LISDEXAMFETAMINE DIMESYLATE 20 MG PO CAPS: ORAL_CAPSULE | ORAL | Status: DC

## 2013-05-26 MED ORDER — LISDEXAMFETAMINE DIMESYLATE 40 MG PO CAPS: 40.0000 mg | ORAL_CAPSULE | ORAL | Status: DC

## 2013-05-26 NOTE — Progress Notes (Signed)
Chief Complaint  Patient presents with  . Follow-up    adhd heart burn     HPI: FU adhd and medication Is delayed on fu visit  No major change in health status since last visit . 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  Living off campus  Doing. well  Medication  takes 40 mg of   vyvanse  the morning 20  In pm  Some days     Otherwise  Off weekends or 20 on weekends   Uses melatonin  To help sleep   Takes med every day  School not on weekends a  Has HB when eats badly :Zantac  Prn HBocass advil. Better  And tums.  Gets uper abd p and left upper ? Chest   caffeine  1  No nocturnal. HB  etoh   weekey and biweekly   Neg rd   Dec diet coke.   manages anxiety    Uses CBT  [place] and was quite helpful .  Plans to work for [employer] in [place]  Allergies   No meds  No eia  Sx now Grass allergies.   Sleep : 8 hours  Usually   ROS: See pertinent positives and negatives per HPI.  Past Medical History  Diagnosis Date  . Allergic rhinitis   . Eczema   . ADHD (attention deficit hyperactivity disorder)     evaluation at [facility]  . Exercise-induced asthma     inhaler pre exercise  . Boils 01/29/2011    Family History  Problem Relation Age of Onset  . Anxiety disorder Mother     History   Social History  . Marital Status: Single    Spouse Name: N/A    Number of Children: N/A  . Years of Education: N/A   Social History Main Topics  . Smoking status: Never Smoker   . Smokeless tobacco: None  . Alcohol Use: None  . Drug Use: None  . Sexual Activity: None   Other Topics Concern  . None   Social History Narrative   HH of 3   No ets   junitor   [employer]    Lives .[facility]    [employer]egu[employer] in summer   Parents Onalee Hua and Darianna Amy   Plays tennis    Outpatient Encounter Prescriptions as of 05/26/2013  Medication Sig  . EPINEPHrine (EPI-PEN) 0.3 mg/0.3 mL DEVI Inject 0.3 mLs (0.3 mg total) into the muscle once.  .  lisdexamfetamine (VYVANSE) 20 MG capsule Can take after 40 mg dose as directed once a day  . lisdexamfetamine (VYVANSE) 40 MG capsule Take 1 capsule (40 mg total) by mouth every morning. Can add on 20 mg as directed  . [DISCONTINUED] lisdexamfetamine (VYVANSE) 20 MG capsule Can take after 40 mg dose as directed once a day  . [DISCONTINUED] lisdexamfetamine (VYVANSE) 40 MG capsule Take 1 capsule (40 mg total) by mouth every morning. Can add on 20 mg as directed  . albuterol (VENTOLIN HFA) 108 (90 BASE) MCG/ACT inhaler Inhale 2 puffs into the lungs every 6 (six) hours as needed for wheezing (or pre exercise).  . cetirizine (ZYRTEC) 10 MG tablet Take 10 mg by mouth daily.    EXAM:  BP 94/50  Pulse 84  Temp(Src) 98.1 F (36.7 C) (Oral)  Wt 148 lb (67.132 kg)  SpO2 98%  Body mass index is 26.22 kg/(m^2).  GENERAL: vitals reviewed and listed above, alert, oriented,  appears well hydrated and in no acute distress  HEENT: atraumatic, conjunctiva  clear, no obvious abnormalities on inspection of external nose and ears OP : no lesion edema or exudate  NECK: no obvious masses on inspection palpation no adenopathy LUNGS: clear to auscultation bilaterally, no wheezes, rales or rhonchi, good air movement CV: HRRR, no clubbing cyanosis or  peripheral edema nl cap refill  Abdomen:  Sof,t normal bowel sounds without hepatosplenomegaly, no guarding rebound or masses no CVA tenderness MS: moves all extremities without noticeable focal  abnormality PSYCH: pleasant and cooperative, no obvious depression  Rapid speech mild anxiety  Good eye contact   No tremor or tic  ASSESSMENT AND PLAN:  Discussed the following assessment and plan:  ADHD (attention deficit hyperactivity disorder)  Medication management  GERD (gastroesophageal reflux disease) - can be controlled with diet but "horrendous" near exam time with stress and eating differently Disc limit tums to avoid rebound acid also  Disc risk benefit   meds  To sign contract and screen confidential  Today  90 days  Given cuation with the 2 rx s  Pt decline a pap at this time no sx  -Patient advised to return or notify health care team  if symptoms worsen or persist or new concerns arise.  Patient Instructions  Get a pap  In the next year.     Continue lifestyle intervention healthy eating and exercise . 150 minutes of exercise weeks  ,weight   healthy levels. Avoid trans fats and processed foods;  Increase fresh fruits and veges to 5 servings per day. And avoid sweet beverages  Including tea and juice. Get enough sleep   Trial of omeprazole Prilosec  for  2 weeks . Or zantac  150 twice a day and then as needed.   mindfulness of dietary intake.  ROV in 6 months or as needed .      Diet for Gastroesophageal Reflux Disease, Adult Reflux (acid reflux) is when acid from your stomach flows up into the esophagus. When acid comes in contact with the esophagus, the acid causes irritation and soreness (inflammation) in the esophagus. When reflux happens often or so severely that it causes damage to the esophagus, it is called gastroesophageal reflux disease (GERD). Nutrition therapy can help ease the discomfort of GERD. FOODS OR DRINKS TO AVOID OR LIMIT  Smoking or chewing tobacco. Nicotine is one of the most potent stimulants to acid production in the gastrointestinal tract.  Caffeinated and decaffeinated coffee and black tea.  Regular or low-calorie carbonated beverages or energy drinks (caffeine-free carbonated beverages are allowed).   Strong spices, such as black pepper, white pepper, red pepper, cayenne, curry powder, and chili powder.  Peppermint or spearmint.  Chocolate.  High-fat foods, including meats and fried foods. Extra added fats including oils, butter, salad dressings, and nuts. Limit these to less than 8 tsp per day.  Fruits and vegetables if they are not tolerated, such as citrus fruits or tomatoes.  Alcohol.  Any  food that seems to aggravate your condition. If you have questions regarding your diet, call your caregiver or a registered dietitian. OTHER THINGS THAT MAY HELP GERD INCLUDE:   Eating your meals slowly, in a relaxed setting.  Eating 5 to 6 small meals per day instead of 3 large meals.  Eliminating food for a period of time if it causes distress.  Not lying down until 3 hours after eating a meal.  Keeping the head of your bed raised 6 to  9 inches (15 to 23 cm) by using a foam wedge or blocks under the legs of the bed. Lying flat may make symptoms worse.  Being physically active. Weight loss may be helpful in reducing reflux in overweight or obese adults.  Wear loose fitting clothing EXAMPLE MEAL PLAN This meal plan is approximately 2,000 calories based on https://www.bernard.org/ meal planning guidelines. Breakfast   cup cooked oatmeal.  1 cup strawberries.  1 cup low-fat milk.  1 oz almonds. Snack  1 cup cucumber slices.  6 oz yogurt (made from low-fat or fat-free milk). Lunch  2 slice whole-wheat bread.  2 oz sliced [place].  2 tsp mayonnaise.  1 cup blueberries.  1 cup snap peas. Snack  6 whole-wheat crackers.  1 oz string cheese. Dinner   cup brown rice.  1 cup mixed veggies.  1 tsp olive oil.  3 oz grilled fish. Document Released: 06/17/2005 Document Revised: 09/09/2011 Document Reviewed: 05/03/2011 [facility] Patient Information 2014 [place], [place].      Neta Mends. Esdras Delair M.D.  Pre visit review using our clinic review tool, if applicable. No additional management support is needed unless otherwise documented below in the visit note.

## 2013-05-26 NOTE — Patient Instructions (Addendum)
Get a pap  In the next year.     Continue lifestyle intervention healthy eating and exercise . 150 minutes of exercise weeks  ,weight   healthy levels. Avoid trans fats and processed foods;  Increase fresh fruits and veges to 5 servings per day. And avoid sweet beverages  Including tea and juice. Get enough sleep   Trial of omeprazole Prilosec  for  2 weeks . Or zantac  150 twice a day and then as needed.   mindfulness of dietary intake.  ROV in 6 months or as needed .      Diet for Gastroesophageal Reflux Disease, Adult Reflux (acid reflux) is when acid from your stomach flows up into the esophagus. When acid comes in contact with the esophagus, the acid causes irritation and soreness (inflammation) in the esophagus. When reflux happens often or so severely that it causes damage to the esophagus, it is called gastroesophageal reflux disease (GERD). Nutrition therapy can help ease the discomfort of GERD. FOODS OR DRINKS TO AVOID OR LIMIT  Smoking or chewing tobacco. Nicotine is one of the most potent stimulants to acid production in the gastrointestinal tract.  Caffeinated and decaffeinated coffee and black tea.  Regular or low-calorie carbonated beverages or energy drinks (caffeine-free carbonated beverages are allowed).   Strong spices, such as black pepper, white pepper, red pepper, cayenne, curry powder, and chili powder.  Peppermint or spearmint.  Chocolate.  High-fat foods, including meats and fried foods. Extra added fats including oils, butter, salad dressings, and nuts. Limit these to less than 8 tsp per day.  Fruits and vegetables if they are not tolerated, such as citrus fruits or tomatoes.  Alcohol.  Any food that seems to aggravate your condition. If you have questions regarding your diet, call your caregiver or a registered dietitian. OTHER THINGS THAT MAY HELP GERD INCLUDE:   Eating your meals slowly, in a relaxed setting.  Eating 5 to 6 small meals per day  instead of 3 large meals.  Eliminating food for a period of time if it causes distress.  Not lying down until 3 hours after eating a meal.  Keeping the head of your bed raised 6 to 9 inches (15 to 23 cm) by using a foam wedge or blocks under the legs of the bed. Lying flat may make symptoms worse.  Being physically active. Weight loss may be helpful in reducing reflux in overweight or obese adults.  Wear loose fitting clothing EXAMPLE MEAL PLAN This meal plan is approximately 2,000 calories based on https://www.bernard.org/ meal planning guidelines. Breakfast   cup cooked oatmeal.  1 cup strawberries.  1 cup low-fat milk.  1 oz almonds. Snack  1 cup cucumber slices.  6 oz yogurt (made from low-fat or fat-free milk). Lunch  2 slice whole-wheat bread.  2 oz sliced [place].  2 tsp mayonnaise.  1 cup blueberries.  1 cup snap peas. Snack  6 whole-wheat crackers.  1 oz string cheese. Dinner   cup brown rice.  1 cup mixed veggies.  1 tsp olive oil.  3 oz grilled fish. Document Released: 06/17/2005 Document Revised: 09/09/2011 Document Reviewed: 05/03/2011 [facility] Patient Information 2014 [place], [place].

## 2013-06-15 ENCOUNTER — Ambulatory Visit (HOSPITAL_COMMUNITY)
Admission: RE | Admit: 2013-06-15 | Discharge: 2013-06-15 | Disposition: A | Discharge status: Home / Self Care | Payer: BC Managed Care – PPO | Source: Ambulatory Visit | Attending: Orthodontics and Dentofacial Orthopedics | Admitting: Orthodontics and Dentofacial Orthopedics

## 2013-06-15 DIAGNOSIS — M2669 Other specified disorders of temporomandibular joint: Secondary | ICD-10-CM

## 2013-06-15 DIAGNOSIS — R6884 Jaw pain: Secondary | ICD-10-CM | POA: Insufficient documentation

## 2013-07-23 ENCOUNTER — Encounter: Payer: Self-pay | Admitting: Internal Medicine

## 2013-11-23 ENCOUNTER — Telehealth: Payer: Self-pay | Admitting: Internal Medicine

## 2013-11-23 NOTE — Telephone Encounter (Signed)
Pt request refill of   lisdexamfetamine (VYVANSE) 20 MG capsule lisdexamfetamine (VYVANSE) 40 MG capsule Pt would like 90 days of each.

## 2013-11-24 NOTE — Telephone Encounter (Signed)
She is over due for OV for med check  Due April  Have her schedule appt   To be completed within  the next month    rx 30 days until the visit .

## 2013-11-25 MED ORDER — LISDEXAMFETAMINE DIMESYLATE 40 MG PO CAPS: 40.0000 mg | ORAL_CAPSULE | ORAL | Status: DC

## 2013-11-25 MED ORDER — LISDEXAMFETAMINE DIMESYLATE 20 MG PO CAPS: ORAL_CAPSULE | ORAL | Status: DC

## 2013-11-25 NOTE — Telephone Encounter (Signed)
I have printed the prescriptions and put them at the front desk.  Please notify pt and make appt in the next 30 days.  Thanks!

## 2013-11-26 NOTE — Telephone Encounter (Signed)
lmovm that rx is ready for pu. Also pt needs to make fu appt asap.

## 2013-12-06 ENCOUNTER — Ambulatory Visit (INDEPENDENT_AMBULATORY_CARE_PROVIDER_SITE_OTHER): Payer: BC Managed Care – PPO | Admitting: Internal Medicine

## 2013-12-06 ENCOUNTER — Encounter: Payer: Self-pay | Admitting: Internal Medicine

## 2013-12-06 VITALS — BP 100/60 | Temp 98.7°F | Ht 62.75 in | Wt 164.0 lb

## 2013-12-06 DIAGNOSIS — J45990 Exercise induced bronchospasm: Secondary | ICD-10-CM

## 2013-12-06 DIAGNOSIS — F909 Attention-deficit hyperactivity disorder, unspecified type: Secondary | ICD-10-CM

## 2013-12-06 DIAGNOSIS — Z79899 Other long term (current) drug therapy: Secondary | ICD-10-CM

## 2013-12-06 MED ORDER — LISDEXAMFETAMINE DIMESYLATE 30 MG PO CAPS: 30.0000 mg | ORAL_CAPSULE | Freq: Every day | ORAL | Status: AC

## 2013-12-06 MED ORDER — LISDEXAMFETAMINE DIMESYLATE 20 MG PO CAPS: ORAL_CAPSULE | ORAL | Status: AC

## 2013-12-06 NOTE — Patient Instructions (Signed)
Try vyvanse  30 mg    Each day and 20 mg as add on if needed. Establish with a primary care   Provider  At you new  Location. You should have a pap smear  And lipid panel screening this year.   Let us know how we can help in transition. Stay healthy.

## 2013-12-06 NOTE — Progress Notes (Signed)
Chief Complaint  Patient presents with  . Follow-up    adhd medication management    HPI: Fu add adhd medication management  Now out of school [place] .  ADHD medication:  Last may 4 th  Using 40 Vyvanse   in am and 20 mg add on most day but now out of school. No sig se of med no cp sob mood sleep changes  Asthma:No need for inhaler .  Moving July  27th .   To begin work  [place].  hasn't had pap smear yet.  No significant side effects such as major sleep issues and mood changes, chest pain, shortness of breath, headaches , GI or significant weight loss. GI: doing well   ROS: See pertinent positives and negatives per HPI. Denies depression anxiety issues  Past Medical History  Diagnosis Date  . Allergic rhinitis   . Eczema   . ADHD (attention deficit hyperactivity disorder)     [facility]  . Exercise-induced asthma     inhaler pre exercise  . Boils 01/29/2011    Family History  Problem Relation Age of Onset  . Anxiety disorder Mother     History   Social History  . Marital Status: Single    Spouse Name: N/A    Number of Children: N/A  . Years of Education: N/A   Social History Main Topics  . Smoking status: Never Smoker   . Smokeless tobacco: None  . Alcohol Use: None  . Drug Use: None  . Sexual Activity: None   Other Topics Concern  . None   Social History Narrative   HH of 3   No ets     [employer]  Graduated 2015 [place] [employer]   [employer] [facility]    Lifeguard in summer   Parents Onalee HuaDavid and Hurshel PartyKelly Rebello   Plays tennis    Outpatient Encounter Prescriptions as of 12/06/2013  Medication Sig  . cetirizine (ZYRTEC) 10 MG tablet Take 10 mg by mouth daily.  Marland Kitchen. EPINEPHrine (EPI-PEN) 0.3 mg/0.3 mL DEVI Inject 0.3 mLs (0.3 mg total) into the muscle once.  . lisdexamfetamine (VYVANSE) 20 MG capsule Can take after 30 mg dose as directed once a day  . [DISCONTINUED] lisdexamfetamine (VYVANSE) 20 MG capsule Can take  after 40 mg dose as directed once a day  . [DISCONTINUED] lisdexamfetamine (VYVANSE) 40 MG capsule Take 1 capsule (40 mg total) by mouth every morning. Can add on 20 mg as directed  . lisdexamfetamine (VYVANSE) 30 MG capsule Take 1 capsule (30 mg total) by mouth daily. Can add on 20 mg in afternoon if needed  . [DISCONTINUED] albuterol (VENTOLIN HFA) 108 (90 BASE) MCG/ACT inhaler Inhale 2 puffs into the lungs every 6 (six) hours as needed for wheezing (or pre exercise).    EXAM:  BP 100/60  Temp(Src) 98.7 F (37.1 C) (Oral)  Ht 5' 2.75" (1.594 m)  Wt 164 lb (74.39 kg)  BMI 29.28 kg/m2  Body mass index is 29.28 kg/(m^2).  GENERAL: vitals reviewed and listed above, alert, oriented, appears well hydrated and in no acute distress HEENT: atraumatic, conjunctiva  clear, no obvious abnormalities on inspection of external nose and ears OP : no lesion edema or exudate  NECK: no obvious masses on inspection palpation  LUNGS: clear to auscultation bilaterally, no wheezes, rales or rhonchi, good air movement Abdomen:  Sof,t normal bowel sounds without hepatosplenomegaly, no guarding rebound or masses  no CVA tenderness CV: HRRR, no clubbing cyanosis or  peripheral edema nl cap refill  MS: moves all extremities without noticeable focal  abnormality PSYCH: pleasant and cooperative, no obvious depression or anxiety Neuro non focal no tremor  ASSESSMENT AND PLAN:  Discussed the following assessment and plan:  ADHD (attention deficit hyperactivity disorder) - disc vyvanase 30 in am and can add on 20 lower dose porb adequate for task at hand .   Medication management  Exercise-induced asthma - minimal quiescent  -Patient advised to return or notify health care team  if symptoms worsen ,persist or new concerns arise.  Patient Instructions  Try vyvanse  30 mg    Each day and 20 mg as add on if needed. Establish with a primary care   Provider  At you new  Location. You should have a pap smear   And lipid panel screening this year.   Let us know how we can help in transition. Stay healthy.    Neta Mends. [employer] M.D.  Pre visit review using our clinic review tool, if applicable. No additional management support is needed unless otherwise documented below in the visit note.

## 2013-12-09 ENCOUNTER — Encounter: Payer: Self-pay | Admitting: Internal Medicine

## 2016-05-22 ENCOUNTER — Other Ambulatory Visit (HOSPITAL_COMMUNITY): Payer: Self-pay | Admitting: Gastroenterology

## 2016-05-22 DIAGNOSIS — R112 Nausea with vomiting, unspecified: Secondary | ICD-10-CM

## 2016-05-22 DIAGNOSIS — K2100 Gastro-esophageal reflux disease with esophagitis, without bleeding: Secondary | ICD-10-CM

## 2016-05-24 ENCOUNTER — Ambulatory Visit (HOSPITAL_COMMUNITY)
Admission: RE | Admit: 2016-05-24 | Discharge: 2016-05-24 | Disposition: A | Discharge status: Home / Self Care | Payer: BLUE CROSS/BLUE SHIELD | Source: Ambulatory Visit | Attending: Gastroenterology | Admitting: Gastroenterology

## 2016-05-24 DIAGNOSIS — R112 Nausea with vomiting, unspecified: Secondary | ICD-10-CM | POA: Diagnosis present

## 2016-05-24 DIAGNOSIS — K2100 Gastro-esophageal reflux disease with esophagitis, without bleeding: Secondary | ICD-10-CM

## 2018-05-10 IMAGING — RF DG UGI W/ HIGH DENSITY W/KUB
10 of 14 series · 13 of 20 positions shown · non-contrast
Comparison: None

CLINICAL DATA: Reflux esophagitis.  Vomiting with dry heaves.

EXAM:
UPPER GI SERIES WITH KUB
TECHNIQUE: After obtaining a scout radiograph a routine upper GI series was
performed using thin and high density barium.
FLUOROSCOPY TIME:  Fluoroscopy Time:  2 minutes and 54 seconds
Number of Acquired Spot Images: 0

[Series 1: t abdomen supine · 0.15mm/px · 1 of 1 slices shown]
[im 1/1]
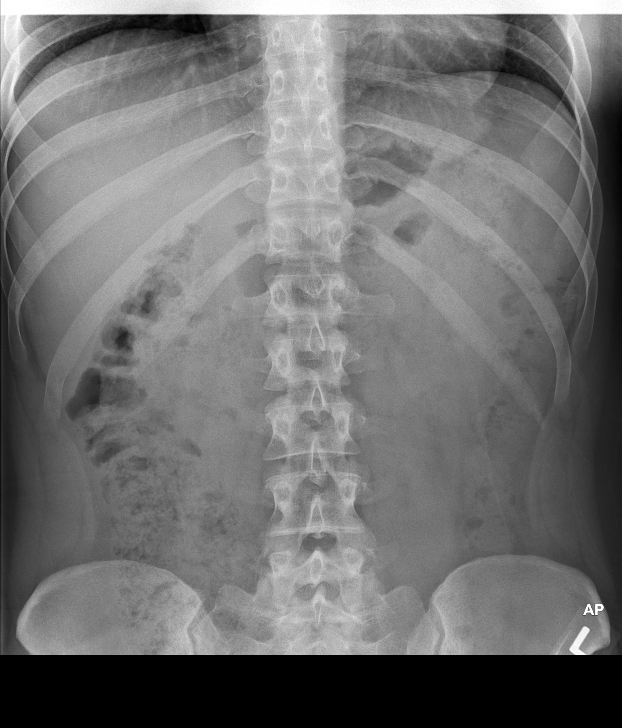

[Series 3: fluoro_barium 2fps_bw · 0.20mm/px · 1 of 1 slices shown (1 of 2)]
[im 1/1]
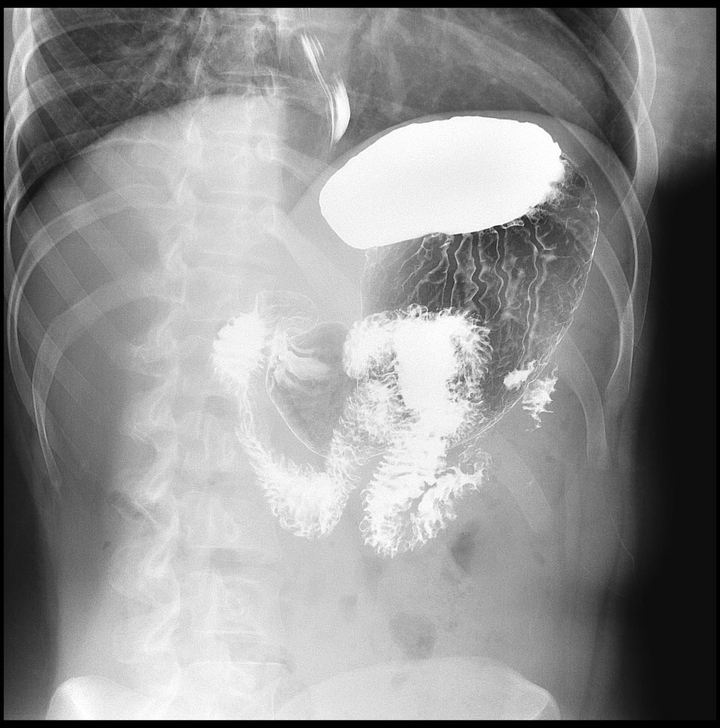

[Series 4: fluoro_barium 2fps_bw · 0.19mm/px · 1 of 1 slices shown (2 of 2)]
[im 1/1]
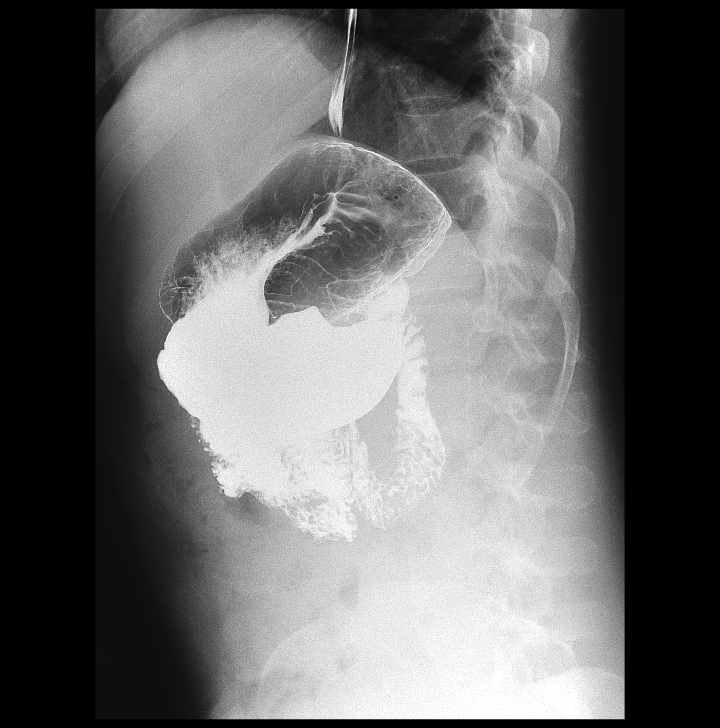

[Series 6: cp_standard · 0.18mm/px · 1 of 1 slices shown (1 of 7)]
[im 1/1]
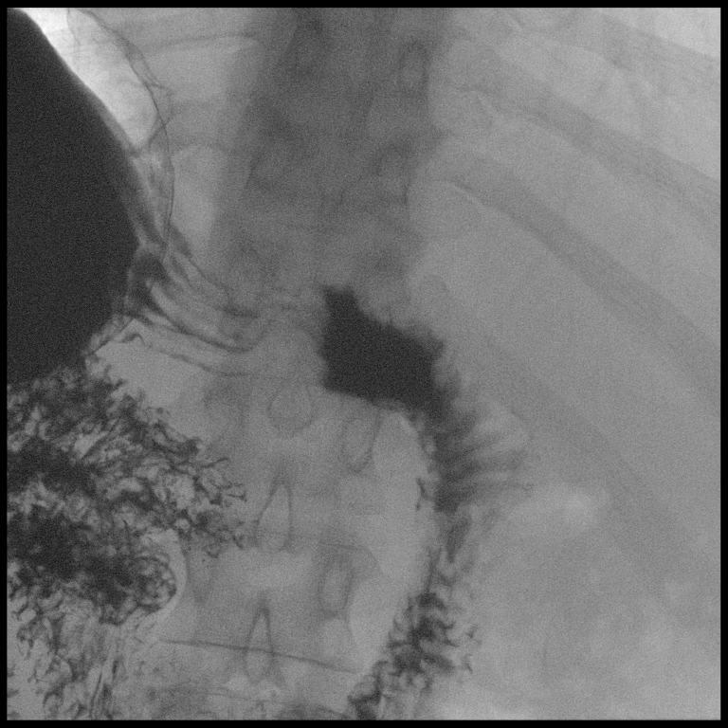

[Series 7: cp_standard · 0.18mm/px · 1 of 1 slices shown (2 of 7)]
[im 1/1]
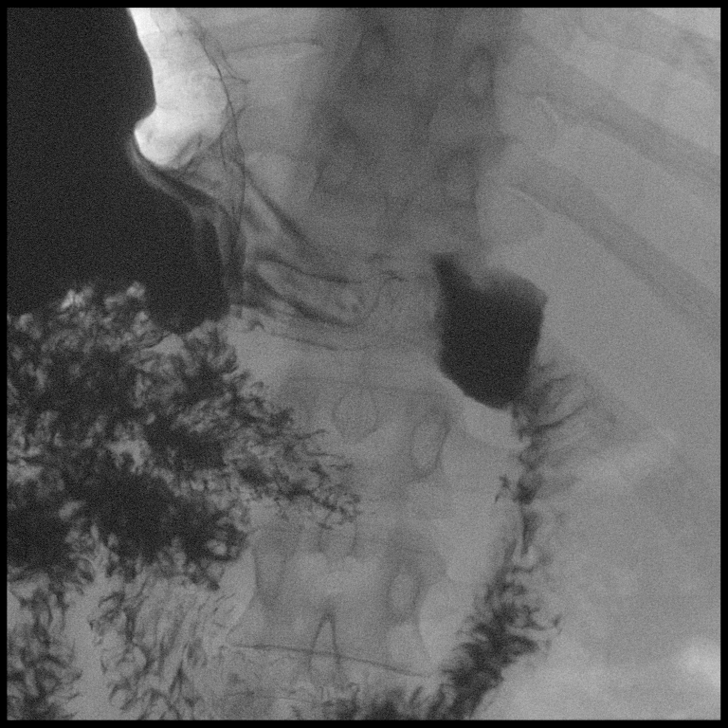

[Series 8: cp_standard · 0.53mm/px · 2 of 55 frames shown (3 of 7)]
[frame 28/55]
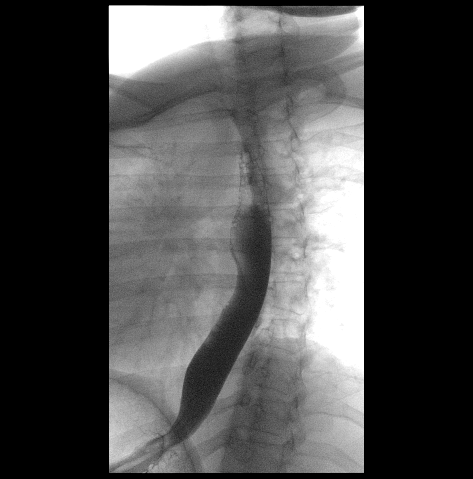
[frame 49/55]
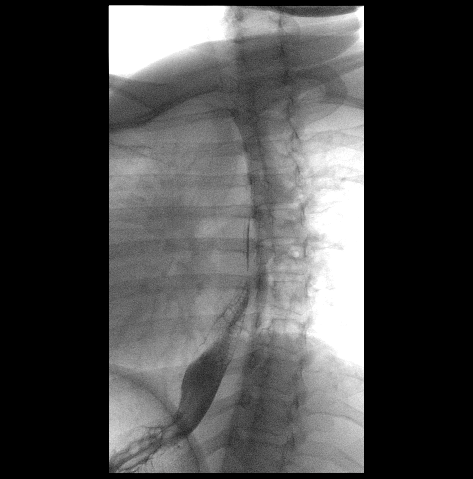

[Series 9: cp_standard · 0.53mm/px · 3 of 89 frames shown (4 of 7)]
[frame 6/89]
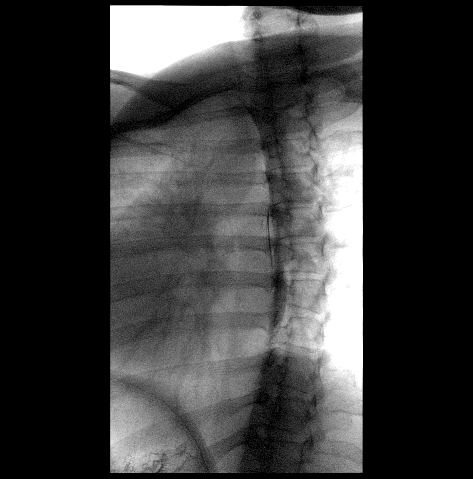
[frame 45/89]
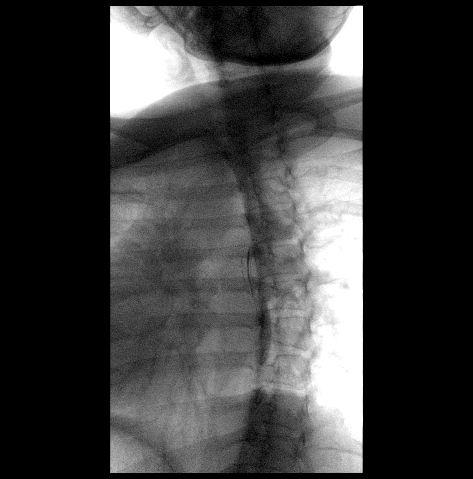
[frame 76/89]
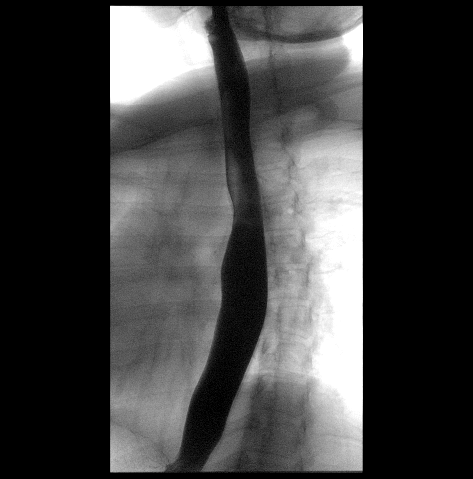

[Series 11: cp_standard · 0.18mm/px · 1 of 1 slices shown (5 of 7)]
[im 1/1]
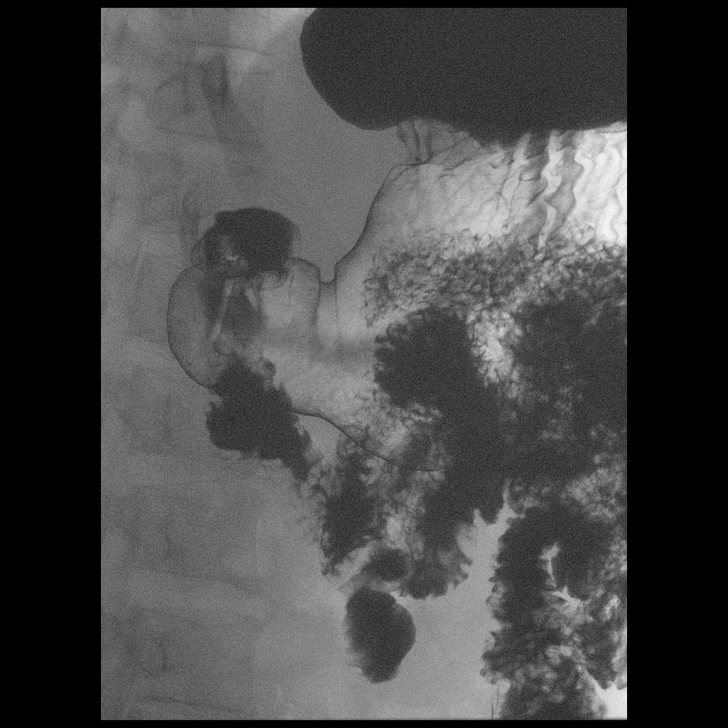

[Series 12: cp_standard · 0.18mm/px · 1 of 1 slices shown (6 of 7)]
[im 1/1]
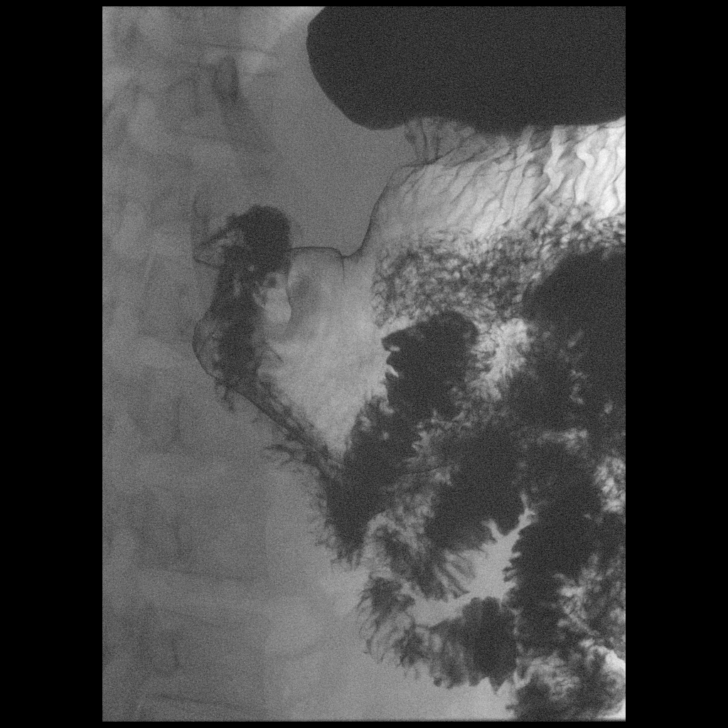

[Series 14: cp_standard · 0.28mm/px · 1 of 1 slices shown (7 of 7)]
[im 1/1]
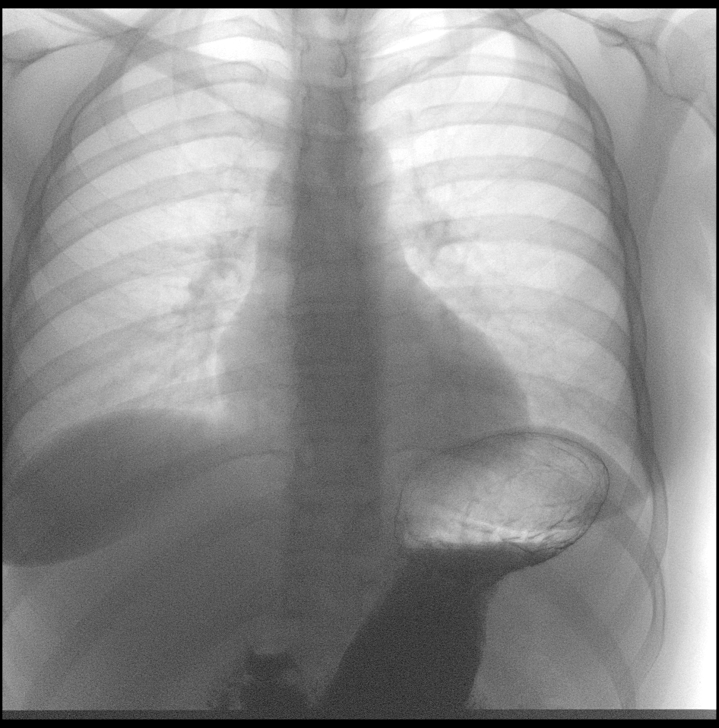

[13 of 20 positions shown; findings below may reference images not displayed]

FINDINGS: Preprocedure scout film is unremarkable.Double contrast appearance
of the stomach is normal, without mass or ulcer. Normal duodenal
bulb and C- loop.

Evaluation of primary peristalsis demonstrates a normal primary
peristaltic wave on each swallow.

Full column evaluation of the esophagus demonstrates no persistent
narrowing or stricture.

A 13 mm barium tablet passes promptly.
IMPRESSION: Normal upper GI.

## 2018-09-15 ENCOUNTER — Ambulatory Visit (INDEPENDENT_AMBULATORY_CARE_PROVIDER_SITE_OTHER): Payer: 59 | Admitting: Family Medicine

## 2018-09-15 ENCOUNTER — Other Ambulatory Visit: Payer: Self-pay

## 2018-09-15 ENCOUNTER — Ambulatory Visit: Payer: Self-pay | Admitting: Internal Medicine

## 2018-09-15 ENCOUNTER — Encounter: Payer: Self-pay | Admitting: Family Medicine

## 2018-09-15 ENCOUNTER — Telehealth: Payer: Self-pay | Admitting: Internal Medicine

## 2018-09-15 VITALS — BP 112/78 | HR 74 | Temp 98.1°F

## 2018-09-15 DIAGNOSIS — R6889 Other general symptoms and signs: Secondary | ICD-10-CM

## 2018-09-15 DIAGNOSIS — B349 Viral infection, unspecified: Secondary | ICD-10-CM

## 2018-09-15 LAB — POCT INFLUENZA A/B
Influenza A, POC: NEGATIVE
Influenza B, POC: NEGATIVE

## 2018-09-15 NOTE — Telephone Encounter (Signed)
Questions for Screening COVID-19  Symptom onset:  Temp - 102 Chest tightness Headache Sore throat  Travel or Contacts:  Contact with TWO confirmed confirmed cases - one on 3/4 another on 3/13 Flew from [place] 3/14 -- lives in [place]  During this illness, did/does the patient experience any of the following symptoms? Fever >100.72F [x]   Yes []   No []   Unknown Subjective fever (felt feverish) [x]   Yes []   No []   Unknown Chills [x]   Yes []   No []   Unknown Muscle aches (myalgia) [x]   Yes []   No []   Unknown Runny nose (rhinorrhea) []   Yes [x]   No []   Unknown Sore throat [x]   Yes []   No []   Unknown Cough (new onset or worsening of chronic cough) []   Yes [x]   No []   Unknown Shortness of breath (dyspnea) []   Yes [x]   No []   Unknown Nausea or vomiting []   Yes [x]   No []   Unknown Headache [x]   Yes []   No []   Unknown Abdominal pain  []   Yes [x]   No []   Unknown Diarrhea (?3 loose/looser than normal stools/24hr period) []   Yes [x]   No []   Unknown Other, specify:_____________________________________________   Patient risk factors: Smoker? []   Current []   Former []   Never If female, currently pregnant? []   Yes []   No  Patient Active Problem List   Diagnosis Date Noted  . GERD (gastroesophageal reflux disease) 05/26/2013  . Exercise-induced asthma   . Medication management 11/09/2011  . Allergic rhinitis, cause unspecified 11/09/2011  . Allergic rhinitis   . Eczema   . ADHD (attention deficit hyperactivity disorder) 01/01/2011  . ADHD (attention deficit hyperactivity disorder)   . ECZEMA 02/26/2007    Plan:  []   High risk for COVID-19 with red flags go to ED (with CP, SOB, weak/lightheaded, or fever > 101.5). Call ahead.  [x]   High risk for COVID-19 but stable will have car visit. Inform provider and coordinate time. Will be completed in afternoon. []   No red flags but URI signs or symptoms will go through side door and be seen in dedicated room.  Note: Referral to telemedicine is an  appropriate alternative disposition for higher risk but stable. [facility]/e-Visit: [phone].

## 2018-09-15 NOTE — Telephone Encounter (Signed)
Per DrFry he will see pt this afternoon, pt has been scheduled and is aware.

## 2018-09-15 NOTE — Telephone Encounter (Signed)
Pt. Reports she was in [place] from March 4-14. A friend she was with ,in her apartment for 5 hours, tested positive yesterday. Pt. Returned from [place] Saturday March 14. Pt. Reports she now has a fever of 102, and "my lung capacity feels diminished. Not short of breath." Pt. Will self quarantine. Notified Sheena in the practice.  Reason for Disposition . [1] COVID-19 EXPOSURE within last 14 days AND [2] NO cough, fever, or breathing difficulty  Answer Assessment - Initial Assessment Questions 1. PLACE of CONTACT: "Where were you when you were exposed to COVID-19  (coronavirus disease 2019)?" (e.g., city, state, country)     [place] 2. TYPE of CONTACT: "How much contact was there?" (e.g., live in same house, work in same office, same school)     Apartment with 2 friends 3. DATE of CONTACT: "When did you have contact with a coronavirus patient?" (e.g., days)     September 11, 2018 4. DURATION of CONTACT: "How long were you in contact with the COVID-19 (coronavirus disease) patient?" (e.g., a few seconds, passed by person, a few minutes, live with the patient)     5 hours 5. SYMPTOMS: "Do you have any symptoms?" (e.g., fever, cough, breathing difficulty)     Chest tightness 6. PREGNANCY OR POSTPARTUM: "Is there any chance you are pregnant?" "When was your last menstrual period?" "Did you deliver in the last 2 weeks?"     No 7. HIGH RISK: "Do you have any heart or lung problems? Do you have a weakened immune system?" (e.g., CHF, COPD, asthma, HIV positive, chemotherapy, renal failure, diabetes mellitus, sickle cell anemia)     No  Protocols used: CORONAVIRUS (COVID-19) EXPOSURE-A-AH

## 2018-09-15 NOTE — Telephone Encounter (Signed)
Dr. Panosh is out.  Dr. Fry - Please advise. Thanks! 

## 2018-09-15 NOTE — Patient Instructions (Signed)
Person Under Monitoring Name: Jessica Leach  Location: [address] [place] [address]   Infection Prevention Recommendations for Individuals Confirmed to have, or Being Evaluated for, 2019 Novel Coronavirus (COVID-19) Infection Who Receive Care at Home  Individuals who are confirmed to have, or are being evaluated for, COVID-19 should follow the prevention steps below until a healthcare provider or local or state health department says they can return to normal activities.  Stay home except to get medical care You should restrict activities outside your home, except for getting medical care. Do not go to work, school, or public areas, and do not use public transportation or taxis.  Call ahead before visiting your doctor Before your medical appointment, call the healthcare provider and tell them that you have, or are being evaluated for, COVID-19 infection. This will help the healthcare providers office take steps to keep other people from getting infected. Ask your healthcare provider to call the local or state health department.  Monitor your symptoms Seek prompt medical attention if your illness is worsening (e.g., difficulty breathing). Before going to your medical appointment, call the healthcare provider and tell them that you have, or are being evaluated for, COVID-19 infection. Ask your healthcare provider to call the local or state health department.  Wear a facemask You should wear a facemask that covers your nose and mouth when you are in the same room with other people and when you visit a healthcare provider. People who live with or visit you should also wear a facemask while they are in the same room with you.  Separate yourself from other people in your home As much as possible, you should stay in a different room from other people in your home. Also, you should use a separate bathroom, if available.  Avoid sharing household items You should  not share dishes, drinking glasses, cups, eating utensils, towels, bedding, or other items with other people in your home. After using these items, you should wash them thoroughly with soap and water.  Cover your coughs and sneezes Cover your mouth and nose with a tissue when you cough or sneeze, or you can cough or sneeze into your sleeve. Throw used tissues in a lined trash can, and immediately wash your hands with soap and water for at least 20 seconds or use an alcohol-based hand rub.  Wash your [employer] your hands often and thoroughly with soap and water for at least 20 seconds. You can use an alcohol-based hand sanitizer if soap and water are not available and if your hands are not visibly dirty. Avoid touching your eyes, nose, and mouth with unwashed hands.   Prevention Steps for Caregivers and Household Members of Individuals Confirmed to have, or Being Evaluated for, COVID-19 Infection Being Cared for in the Home  If you live with, or provide care at home for, a person confirmed to have, or being evaluated for, COVID-19 infection please follow these guidelines to prevent infection:  Follow healthcare providers instructions Make sure that you understand and can help the patient follow any healthcare provider instructions for all care.  Provide for the patients basic needs You should help the patient with basic needs in the home and provide support for getting groceries, prescriptions, and other personal needs.  Monitor the patients symptoms If they are getting sicker, call his or her medical provider and tell them that the patient has, or is being evaluated for, COVID-19 infection. This will help the healthcare providers  office take steps to keep other people from getting infected. Ask the healthcare provider to call the local or state health department.  Limit the number of people who have contact with the patient  If possible, have only one caregiver for the  patient.  Other household members should stay in another home or place of residence. If this is not possible, they should stay  in another room, or be separated from the patient as much as possible. Use a separate bathroom, if available.  Restrict visitors who do not have an essential need to be in the home.  Keep older adults, very young children, and other sick people away from the patient Keep older adults, very young children, and those who have compromised immune systems or chronic health conditions away from the patient. This includes people with chronic heart, lung, or kidney conditions, diabetes, and cancer.  Ensure good ventilation Make sure that shared spaces in the home have good air flow, such as from an air conditioner or an opened window, weather permitting.  Wash your hands often  Wash your hands often and thoroughly with soap and water for at least 20 seconds. You can use an alcohol based hand sanitizer if soap and water are not available and if your hands are not visibly dirty.  Avoid touching your eyes, nose, and mouth with unwashed hands.  Use disposable paper towels to dry your hands. If not available, use dedicated cloth towels and replace them when they become wet.  Wear a facemask and gloves  Wear a disposable facemask at all times in the room and gloves when you touch or have contact with the patients blood, body fluids, and/or secretions or excretions, such as sweat, saliva, sputum, nasal mucus, vomit, urine, or feces.  Ensure the mask fits over your nose and mouth tightly, and do not touch it during use.  Throw out disposable facemasks and gloves after using them. Do not reuse.  Wash your hands immediately after removing your facemask and gloves.  If your personal clothing becomes contaminated, carefully remove clothing and launder. Wash your hands after handling contaminated clothing.  Place all used disposable facemasks, gloves, and other waste in a lined  container before disposing them with other household waste.  Remove gloves and wash your hands immediately after handling these items.  Do not share dishes, glasses, or other household items with the patient  Avoid sharing household items. You should not share dishes, drinking glasses, cups, eating utensils, towels, bedding, or other items with a patient who is confirmed to have, or being evaluated for, COVID-19 infection.  After the person uses these items, you should wash them thoroughly with soap and water.  Wash laundry thoroughly  Immediately remove and wash clothes or bedding that have blood, body fluids, and/or secretions or excretions, such as sweat, saliva, sputum, nasal mucus, vomit, urine, or feces, on them.  Wear gloves when handling laundry from the patient.  Read and follow directions on labels of laundry or clothing items and detergent. In general, wash and dry with the warmest temperatures recommended on the label.  Clean all areas the individual has used often  Clean all touchable surfaces, such as counters, tabletops, doorknobs, bathroom fixtures, toilets, phones, keyboards, tablets, and bedside tables, every day. Also, clean any surfaces that may have blood, body fluids, and/or secretions or excretions on them.  Wear gloves when cleaning surfaces the patient has come in contact with.  Use a diluted bleach solution (e.g., dilute bleach with 1  part bleach and 10 parts water) or a household disinfectant with a label that says EPA-registered for coronaviruses. To make a bleach solution at home, add 1 tablespoon of bleach to 1 quart (4 cups) of water. For a larger supply, add  cup of bleach to 1 gallon (16 cups) of water.  Read labels of cleaning products and follow recommendations provided on product labels. Labels contain instructions for safe and effective use of the cleaning product including precautions you should take when applying the product, such as wearing gloves or  eye protection and making sure you have good ventilation during use of the product.  Remove gloves and wash hands immediately after cleaning.  Monitor yourself for signs and symptoms of illness Caregivers and household members are considered close contacts, should monitor their health, and will be asked to limit movement outside of the home to the extent possible. Follow the monitoring steps for close contacts listed on the symptom monitoring form.   ? If you have additional questions, contact your local health department or call the epidemiologist on call at [phone]  (available 24/7). ? This guidance is subject to change. For the most up-to-date guidance from [facility], please refer to their website: [email or web address].hu     Person Under Monitoring Name: Jessica Leach  Location: [address] [place] [address]   CORONAVIRUS DISEASE 2019 (COVID-19) Guidance for Persons Under Investigation You are being tested for the virus that causes coronavirus disease 2019 (COVID-19). Public health actions are necessary to ensure protection of your health and the health of others, and to prevent further spread of infection. COVID-19 is caused by a virus that can cause symptoms, such as fever, cough, and shortness of breath. The primary transmission from person to person is by coughing or sneezing. On July 30, 2018, the World Health Organization announced a [employer] Emergency of International Concern and on July 31, 2018 the U.S. Department of Health and Human Services declared a public health emergency. If the virus that causesCOVID-19 spreads in the community, it could have severe public health consequences.  As a person under investigation for COVID-19, the [employer] of Health and [employer], Division of [employer] advises you to adhere to the following guidance until your test results are reported  to you. If your test result is positive, you will receive additional information from your provider and your local health department at that time.   Remain at home until you are cleared by your health provider or public health authorities.   Keep a log of visitors to your home using the form provided. Any visitors to your home must be aware of your isolation status.  If you plan to move to a new address or leave the county, notify the local health department in your county.  Call a doctor or seek care if you have an urgent medical need. Before seeking medical care, call ahead and get instructions from the provider before arriving at the medical office, clinic or hospital. Notify them that you are being tested for the virus that causes COVID-19 so arrangements can be made, as necessary, to prevent transmission to others in the healthcare setting. Next, notify the local health department in your county.  If a medical emergency arises and you need to call 911, inform the first responders that you are being tested for the virus that causes COVID-19. Next, notify the local health department in your county.  Adhere to all guidance set forth by the [place]  [place] Division of Public Health for Home Care of patients that is based on guidance from the Center for Disease Control and Prevention with suspected or confirmed COVID-19. It is provided with this guidance for Persons Under Investigation.  Your health and the health of our community are our top priorities. Public Health officials remain available to provide assistance and counseling to you about COVID-19 and compliance with this guidance.  Provider: ____________________________________________________________ Date: ______/_____/_________  By signing below, you acknowledge that you have read and agree to comply with this Guidance for Persons Under Investigation. ______________________________________________________________ Date:  ______/_____/_________  WHO DO I CALL? You can find a list of local health departments here: [email or web address] Health Department: ____________________________________________________________________ Contact Name: ________________________________________________________________________ Telephone: ___________________________________________________________________________  Nedra Hai, Division of Public Health, Communicable Disease Branch COVID-19 Guidance for Persons Under Investigation September 05, 2018  Person Under Monitoring Name: Jessica Leach  Location: [address] [place] [address]   Record here the list of visitors to your home since you became ill with respiratory symptoms that led you to consult a health provider:  Visitor Name Date Time In Time Out Did this person come within 6 feet of you? Indicate Y or N Relationship to Person Under Monitoring Phone number Comments   ___/____/____ __:__ AM/PM __:__ AM/PM       ___/____/____ __:__ AM/PM __:__ AM/PM       ___/____/____ __:__ AM/PM __:__ AM/PM       ___/____/____ __:__ AM/PM __:__ AM/PM       ___/____/____ __:__ AM/PM __:__ AM/PM       ___/____/____ __:__ AM/PM __:__ AM/PM       ___/____/____ __:__ AM/PM __:__ AM/PM       ___/____/____ __:__ AM/PM __:__ AM/PM       ___/____/____ __:__ AM/PM __:__ AM/PM       ___/____/____ __:__ AM/PM __:__ AM/PM       ___/____/____ __:__ AM/PM __:__ AM/PM       ___/____/____ __:__ AM/PM __:__ AM/PM       ___/____/____ __:__ AM/PM __:__ AM/PM       ___/____/____ __:__ AM/PM __:__ AM/PM       Nedra Hai, Division of Public Health, Communicable Disease Branch

## 2018-09-15 NOTE — Progress Notes (Signed)
   Subjective:    Patient ID: Jessica Leach, female    DOB: [birth year], 28 y.o.   MRN: [id number]  HPI Here for the onset yesterday of a ST and some body aches. Then this am she had a fever of 102 degrees, tightness in her chest, and a headache. No coughing or SOB. No NVD. She has not taken any medication today. She feels a little better now. She lives in [place] and she has had known exposure to 2 different people that have since tested positive for Covid-19. She was with a coworker in her office on 09-03-18 and she found out that he has tested positive on 09-12-18. Also she was with a friend and the friend's roommate in their apartment on 09-11-18, and she found out that the friend's roommate hast tested positive on 09-13-18. Jessica Leach has now been home with her parents since 09-12-18, and she says she has not been around any other people since coming to [place]. Her parents, who are with her today, are not symptomatic.    Review of Systems  Constitutional: Positive for chills and fever.  HENT: Positive for sore throat. Negative for congestion, ear pain, postnasal drip, sinus pressure and sinus pain.   Eyes: Negative.   Respiratory: Positive for chest tightness. Negative for cough, shortness of breath and wheezing.   Cardiovascular: Negative.   Gastrointestinal: Negative.   Genitourinary: Negative.   Musculoskeletal: Positive for myalgias.  Neurological: Positive for headaches.       Objective:   Physical Exam Constitutional:      Appearance: Normal appearance. She is not ill-appearing.  HENT:     Right Ear: Tympanic membrane and ear canal normal.     Left Ear: Tympanic membrane and ear canal normal.     Nose: Nose normal.     Mouth/Throat:     Pharynx: Oropharynx is clear.  Eyes:     Conjunctiva/sclera: Conjunctivae normal.  Neck:     Musculoskeletal: No neck rigidity.  Cardiovascular:     Rate and Rhythm: Normal rate and regular rhythm.     Pulses: Normal pulses.     Heart  sounds: Normal heart sounds.  Pulmonary:     Effort: Pulmonary effort is normal.     Breath sounds: Normal breath sounds.  Abdominal:     General: Abdomen is flat. Bowel sounds are normal. There is no distension.     Palpations: Abdomen is soft. There is no mass.     Tenderness: There is no abdominal tenderness. There is no guarding or rebound.     Hernia: No hernia is present.  Lymphadenopathy:     Cervical: No cervical adenopathy.  Neurological:     Mental Status: She is alert.           Assessment & Plan:  She has symptoms of a viral illness which is not influenza. She has had exposure to Covid-19, so we will refer her to our testing site, along with her parents. She was given paperwork about documenting possible exposures, self quarantine, etc. She can take Tylenol for aches or fever and drink fluids.  Gershon Crane, MD

## 2018-09-22 LAB — NOVEL CORONAVIRUS, NAA: SARS-COV-2, NAA: NOT DETECTED

## 2018-09-23 ENCOUNTER — Telehealth: Payer: Self-pay

## 2018-09-23 NOTE — Telephone Encounter (Signed)
Pt advised her CV-19 results are negative.

## 2018-11-11 ENCOUNTER — Other Ambulatory Visit: Payer: Self-pay | Admitting: Otolaryngology

## 2018-11-11 DIAGNOSIS — R599 Enlarged lymph nodes, unspecified: Secondary | ICD-10-CM

## 2018-11-16 ENCOUNTER — Ambulatory Visit
Admission: RE | Admit: 2018-11-16 | Discharge: 2018-11-16 | Disposition: A | Discharge status: Home / Self Care | Payer: 59 | Source: Ambulatory Visit | Attending: Otolaryngology | Admitting: Otolaryngology

## 2018-11-16 DIAGNOSIS — R599 Enlarged lymph nodes, unspecified: Secondary | ICD-10-CM

## 2020-11-01 IMAGING — US SOFT TISSUE ULTRASOUND HEAD/NECK
1 series · 11 of 11 positions shown · non-contrast
Comparison: None.

CLINICAL DATA: Enlarged lymph nodes.

EXAM:
ULTRASOUND OF HEAD/NECK SOFT TISSUES
TECHNIQUE: Ultrasound examination of the head and neck soft tissues was
performed in the area of clinical concern.

[Series 1: soft tissue ultrasound head/neck · 0.04mm/px · 11 of 11 slices shown]
[im 1/11]
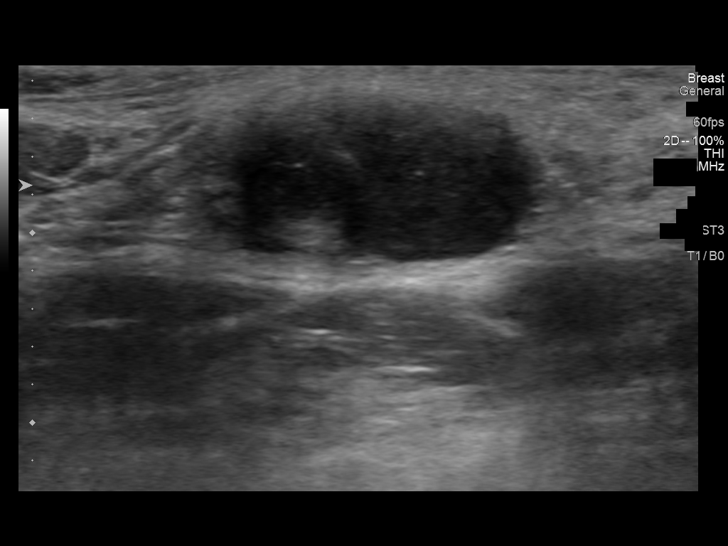
[im 2/11]
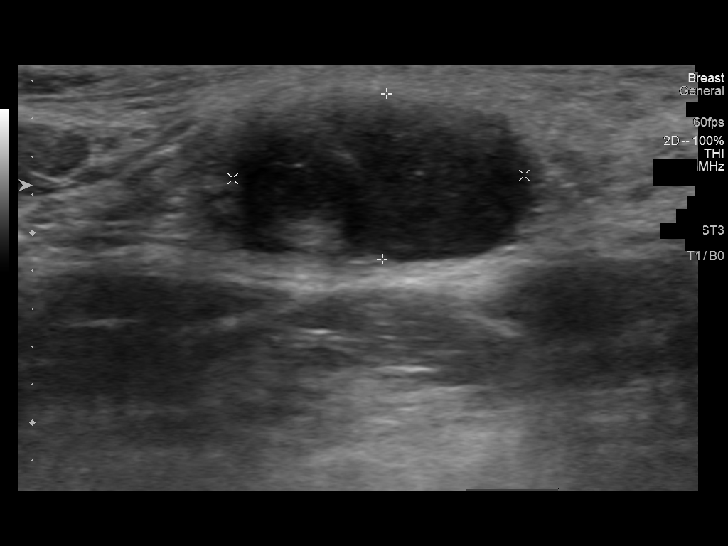
[im 3/11]
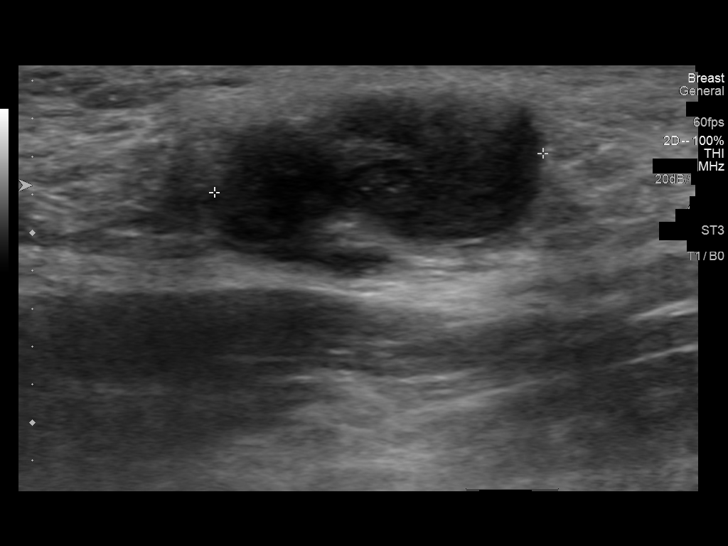
[im 4/11]
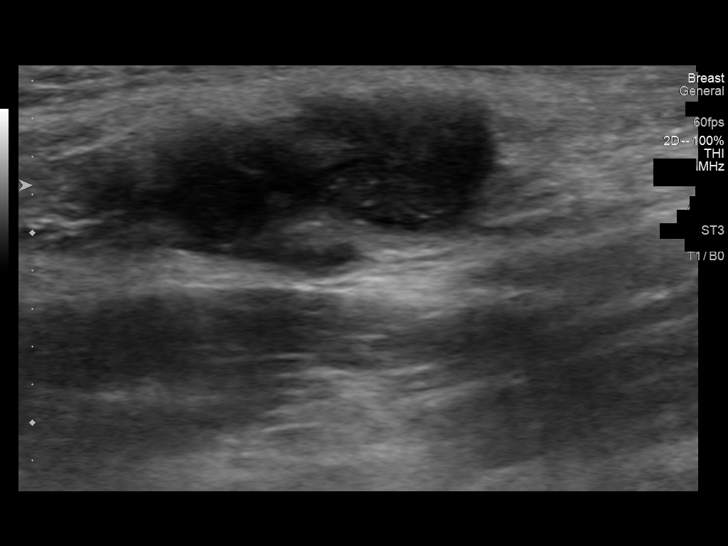
[im 5/11]
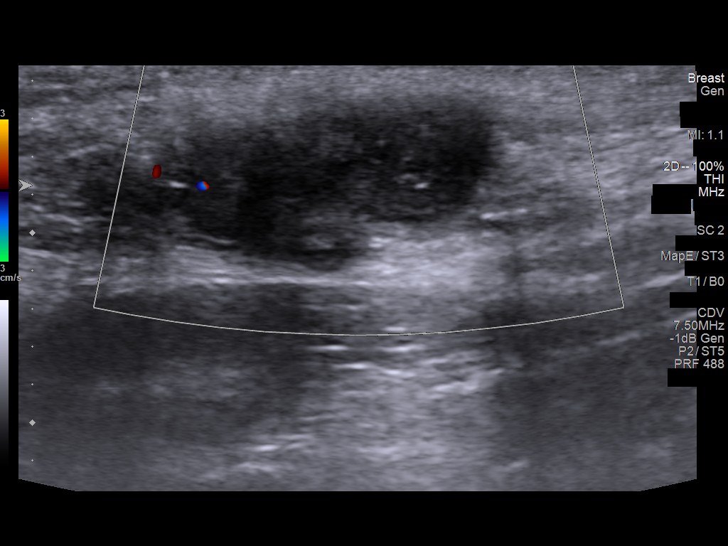
[im 6/11]
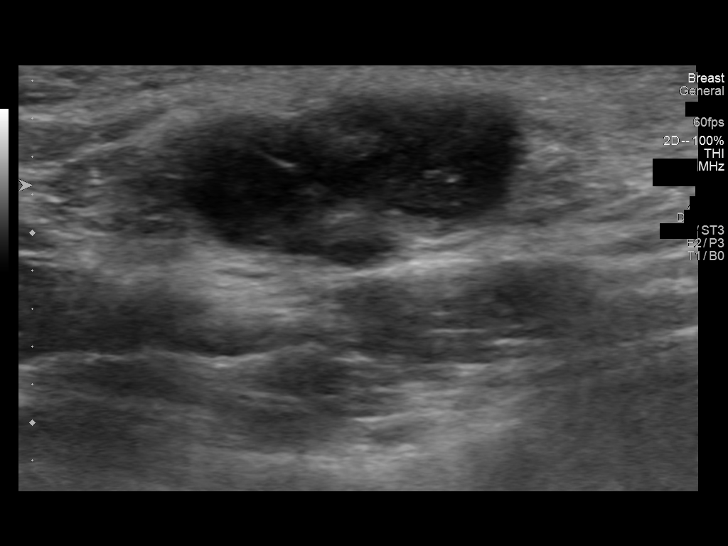
[im 7/11]
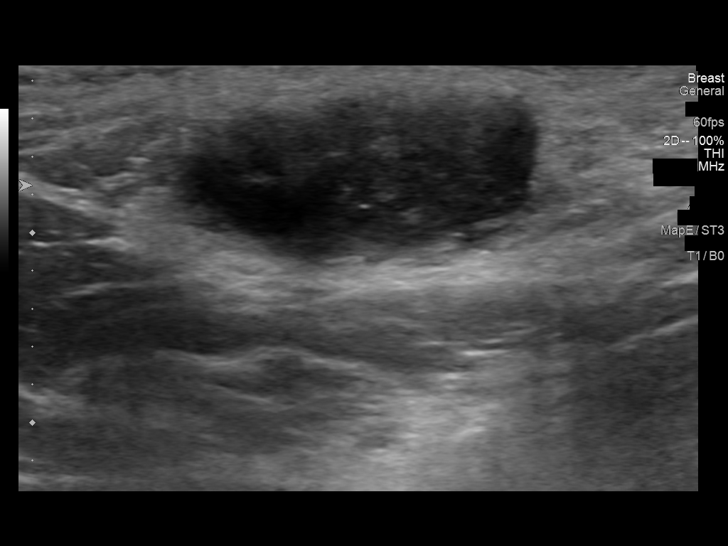
[im 8/11]
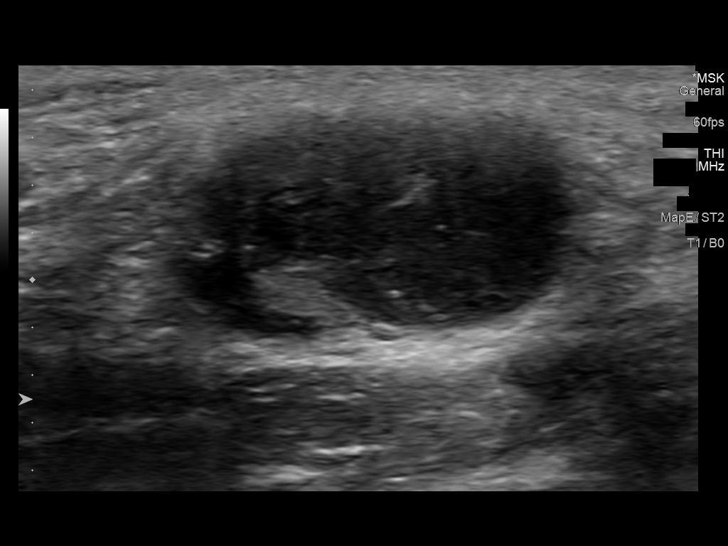
[im 9/11]
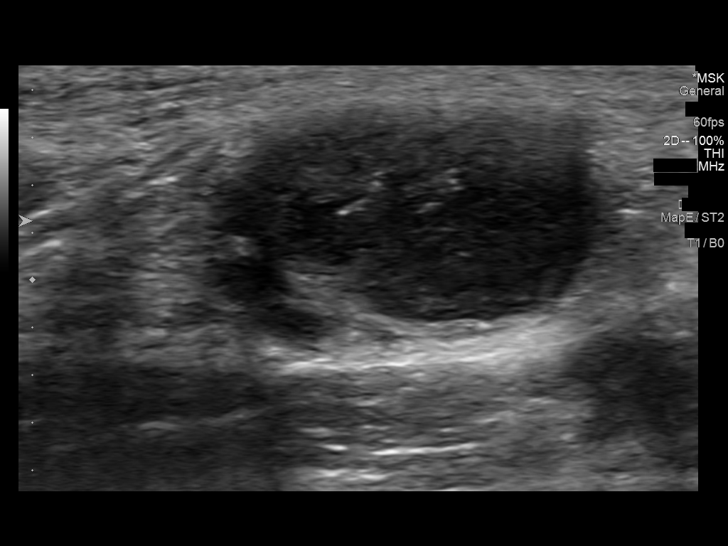
[im 10/11]
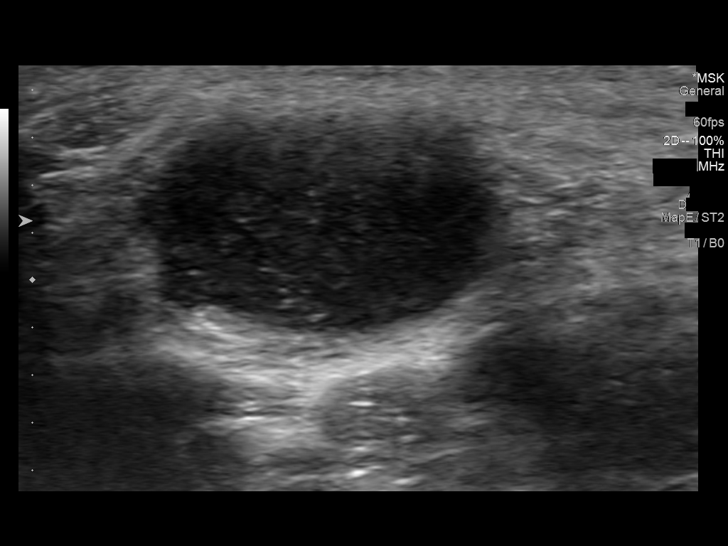
[im 11/11]
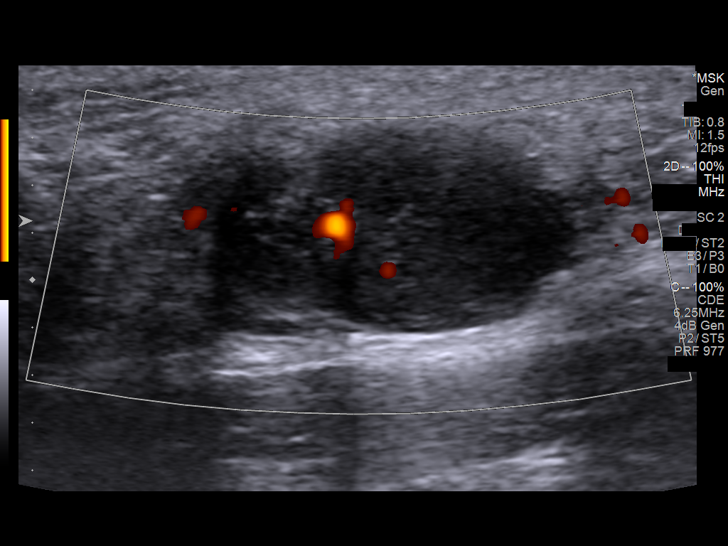

[11 of 11 positions shown; findings below may reference images not displayed]

FINDINGS: See a lymph node in the left lateral neck measures 1.7 x 1.5 x
cm. A well-defined central hilum is noted. No additional nodes are
present.
IMPRESSION: 1. Single enlarged benign appearing lymph node of the left neck
measures up to 1.7 cm.

## 2023-12-23 ENCOUNTER — Other Ambulatory Visit (HOSPITAL_BASED_OUTPATIENT_CLINIC_OR_DEPARTMENT_OTHER): Payer: Self-pay | Admitting: Otolaryngology

## 2023-12-23 DIAGNOSIS — R221 Localized swelling, mass and lump, neck: Secondary | ICD-10-CM

## 2023-12-30 ENCOUNTER — Ambulatory Visit (HOSPITAL_BASED_OUTPATIENT_CLINIC_OR_DEPARTMENT_OTHER)
Admission: RE | Admit: 2023-12-30 | Discharge: 2023-12-30 | Disposition: A | Discharge status: Home / Self Care | Source: Ambulatory Visit | Attending: Otolaryngology | Admitting: Otolaryngology

## 2023-12-30 DIAGNOSIS — R221 Localized swelling, mass and lump, neck: Secondary | ICD-10-CM | POA: Diagnosis present
